# Patient Record
Sex: Female | Born: 1975 | Race: Black or African American | Hispanic: No | Marital: Single | Smoking: Never smoker
Health system: Southern US, Community
[De-identification: ages and names within clinical notes are randomized; demographics above are authoritative.]

## PROBLEM LIST (undated history)

## (undated) DIAGNOSIS — E0500 Thyrotoxicosis with diffuse goiter without thyrotoxic crisis or storm: Secondary | ICD-10-CM

## (undated) DIAGNOSIS — Z9889 Other specified postprocedural states: Secondary | ICD-10-CM

## (undated) HISTORY — PX: TUBAL LIGATION: SHX77

---

## 1998-08-07 ENCOUNTER — Emergency Department (HOSPITAL_COMMUNITY): Admission: EM | Admit: 1998-08-07 | Discharge: 1998-08-07 | Payer: Self-pay | Admitting: Emergency Medicine

## 1999-08-20 ENCOUNTER — Emergency Department (HOSPITAL_COMMUNITY): Admission: EM | Admit: 1999-08-20 | Discharge: 1999-08-20 | Payer: Self-pay

## 2000-02-26 ENCOUNTER — Emergency Department (HOSPITAL_COMMUNITY): Admission: EM | Admit: 2000-02-26 | Discharge: 2000-02-26 | Payer: Self-pay | Admitting: Emergency Medicine

## 2000-08-26 ENCOUNTER — Inpatient Hospital Stay (HOSPITAL_COMMUNITY): Admission: AD | Admit: 2000-08-26 | Discharge: 2000-08-26 | Payer: Self-pay | Admitting: *Deleted

## 2000-08-30 ENCOUNTER — Encounter: Payer: Self-pay | Admitting: *Deleted

## 2000-08-30 ENCOUNTER — Inpatient Hospital Stay (HOSPITAL_COMMUNITY): Admission: AD | Admit: 2000-08-30 | Discharge: 2000-08-30 | Payer: Self-pay | Admitting: *Deleted

## 2000-09-13 ENCOUNTER — Other Ambulatory Visit: Admission: RE | Admit: 2000-09-13 | Discharge: 2000-09-13 | Payer: Self-pay | Admitting: Obstetrics & Gynecology

## 2001-04-14 ENCOUNTER — Inpatient Hospital Stay (HOSPITAL_COMMUNITY): Admission: AD | Admit: 2001-04-14 | Discharge: 2001-04-17 | Payer: Self-pay | Admitting: Obstetrics and Gynecology

## 2002-06-09 ENCOUNTER — Emergency Department (HOSPITAL_COMMUNITY): Admission: EM | Admit: 2002-06-09 | Discharge: 2002-06-09 | Payer: Self-pay | Admitting: Emergency Medicine

## 2002-10-10 ENCOUNTER — Emergency Department (HOSPITAL_COMMUNITY): Admission: EM | Admit: 2002-10-10 | Discharge: 2002-10-10 | Payer: Self-pay | Admitting: Emergency Medicine

## 2003-09-26 ENCOUNTER — Ambulatory Visit (HOSPITAL_COMMUNITY): Admission: RE | Admit: 2003-09-26 | Discharge: 2003-09-26 | Payer: Self-pay | Admitting: Family Medicine

## 2003-10-01 ENCOUNTER — Other Ambulatory Visit: Admission: RE | Admit: 2003-10-01 | Discharge: 2003-10-01 | Payer: Self-pay | Admitting: Obstetrics and Gynecology

## 2003-11-20 ENCOUNTER — Other Ambulatory Visit: Admission: RE | Admit: 2003-11-20 | Discharge: 2003-11-20 | Payer: Self-pay | Admitting: Obstetrics and Gynecology

## 2004-01-13 ENCOUNTER — Ambulatory Visit (HOSPITAL_COMMUNITY): Admission: RE | Admit: 2004-01-13 | Discharge: 2004-01-13 | Payer: Self-pay | Admitting: Obstetrics and Gynecology

## 2004-06-03 ENCOUNTER — Inpatient Hospital Stay (HOSPITAL_COMMUNITY): Admission: RE | Admit: 2004-06-03 | Discharge: 2004-06-06 | Payer: Self-pay | Admitting: Obstetrics and Gynecology

## 2004-06-03 ENCOUNTER — Encounter (INDEPENDENT_AMBULATORY_CARE_PROVIDER_SITE_OTHER): Payer: Self-pay | Admitting: Specialist

## 2004-06-29 ENCOUNTER — Other Ambulatory Visit: Admission: RE | Admit: 2004-06-29 | Discharge: 2004-06-29 | Payer: Self-pay | Admitting: Obstetrics and Gynecology

## 2005-07-12 ENCOUNTER — Other Ambulatory Visit: Admission: RE | Admit: 2005-07-12 | Discharge: 2005-07-12 | Payer: Self-pay | Admitting: Obstetrics and Gynecology

## 2005-11-18 ENCOUNTER — Emergency Department (HOSPITAL_COMMUNITY): Admission: EM | Admit: 2005-11-18 | Discharge: 2005-11-19 | Payer: Self-pay | Admitting: Emergency Medicine

## 2006-06-01 ENCOUNTER — Emergency Department (HOSPITAL_COMMUNITY): Admission: EM | Admit: 2006-06-01 | Discharge: 2006-06-01 | Payer: Self-pay | Admitting: Emergency Medicine

## 2006-08-01 ENCOUNTER — Ambulatory Visit: Payer: Self-pay | Admitting: Family Medicine

## 2006-08-02 ENCOUNTER — Ambulatory Visit: Payer: Self-pay | Admitting: *Deleted

## 2006-10-04 ENCOUNTER — Ambulatory Visit: Payer: Self-pay | Admitting: Family Medicine

## 2006-10-04 ENCOUNTER — Encounter (INDEPENDENT_AMBULATORY_CARE_PROVIDER_SITE_OTHER): Payer: Self-pay | Admitting: Family Medicine

## 2007-01-31 ENCOUNTER — Ambulatory Visit: Payer: Self-pay | Admitting: Internal Medicine

## 2007-03-01 ENCOUNTER — Encounter (INDEPENDENT_AMBULATORY_CARE_PROVIDER_SITE_OTHER): Payer: Self-pay | Admitting: *Deleted

## 2007-09-05 ENCOUNTER — Ambulatory Visit: Payer: Self-pay | Admitting: Family Medicine

## 2007-10-13 ENCOUNTER — Ambulatory Visit: Payer: Self-pay | Admitting: Family Medicine

## 2007-10-13 ENCOUNTER — Encounter (INDEPENDENT_AMBULATORY_CARE_PROVIDER_SITE_OTHER): Payer: Self-pay | Admitting: Family Medicine

## 2007-10-13 LAB — CONVERTED CEMR LAB
Chlamydia, DNA Probe: NEGATIVE
GC Probe Amp, Genital: NEGATIVE

## 2007-11-09 ENCOUNTER — Emergency Department (HOSPITAL_COMMUNITY): Admission: EM | Admit: 2007-11-09 | Discharge: 2007-11-10 | Payer: Self-pay | Admitting: Emergency Medicine

## 2007-11-28 ENCOUNTER — Ambulatory Visit: Payer: Self-pay | Admitting: Internal Medicine

## 2008-03-15 ENCOUNTER — Ambulatory Visit: Payer: Self-pay | Admitting: Internal Medicine

## 2008-06-12 ENCOUNTER — Ambulatory Visit: Payer: Self-pay | Admitting: Internal Medicine

## 2008-06-12 ENCOUNTER — Encounter (INDEPENDENT_AMBULATORY_CARE_PROVIDER_SITE_OTHER): Payer: Self-pay | Admitting: Family Medicine

## 2008-06-12 LAB — CONVERTED CEMR LAB
Bilirubin Urine: NEGATIVE
Hemoglobin, Urine: NEGATIVE
Ketones, ur: NEGATIVE mg/dL
Leukocytes, UA: NEGATIVE
Nitrite: NEGATIVE
Protein, ur: NEGATIVE mg/dL
RBC / HPF: NONE SEEN (ref <3)
Specific Gravity, Urine: 1.022 (ref 1.005–1.03)
Urine Glucose: NEGATIVE mg/dL
Urobilinogen, UA: 1 (ref 0.0–1.0)
pH: 6 (ref 5.0–8.0)

## 2008-06-13 ENCOUNTER — Encounter (INDEPENDENT_AMBULATORY_CARE_PROVIDER_SITE_OTHER): Payer: Self-pay | Admitting: Family Medicine

## 2008-07-19 ENCOUNTER — Emergency Department (HOSPITAL_COMMUNITY): Admission: EM | Admit: 2008-07-19 | Discharge: 2008-07-19 | Payer: Self-pay | Admitting: Emergency Medicine

## 2010-09-29 LAB — POCT RAPID STREP A (OFFICE): Streptococcus, Group A Screen (Direct): NEGATIVE

## 2010-10-30 NOTE — Op Note (Signed)
NAMEJERRELL, Deborah Murphy              ACCOUNT NO.:  [id number]   MEDICAL RECORD NO.:  [id number]          PATIENT TYPE:  INP   LOCATION:  9104                          FACILITY:  [facility]   PHYSICIAN:  Randye Lobo, M.D.   DATE OF BIRTH:  [birth year]   DATE OF PROCEDURE:  DATE OF DISCHARGE:                                 OPERATIVE REPORT   PREOPERATIVE DIAGNOSES:  1.  History of prior cesarean section, desire for repeat.  2.  Desire for permanent sterilization.   POSTOPERATIVE DIAGNOSES:  1.  History of prior cesarean section, desire for repeat cesarean section.  2.  Desire for permanent sterilization.   PROCEDURES:  1.  Repeat low segment transverse cesarean section.  2.  Bilateral tubal ligation by the modified Pomeroy technique.   ANESTHESIA:  Spinal.   FLUIDS REPLACED:  2400 mL Ringer's lactate.   ESTIMATED BLOOD LOSS:  600 mL.   URINE OUTPUT:  350 mL.   COMPLICATIONS:  None.   SPECIMENS:  A portion of the right and left fallopian tubes were sent to  pathology.   INDICATION FOR PROCEDURE:  The patient is a 35 year old gravida 3, para 2-0-  0-2, African-American female at 68 +38 weeks' gestation ([facility] June 14, 2004), who has a history of two prior cesarean sections and a desire for a  repeat cesarean section and the avoidance of labor.  The patient also  desires permanent sterilization, and she declines reversible contraception.  A plan is made to proceed with a repeat cesarean section and bilateral tubal  ligation after risks, benefits, and alternatives are discussed with her.  The patient understands there is a failure rate of the tubal ligation of  approximately one in 250 to one in 300, which may result in either an  intrauterine or ectopic pregnancy.   FINDINGS:  A viable female was delivered at 12:37 p.m.  The Apgars were 9 at  one minute and 9 at five minutes.  The weight was 7 pounds 4 ounces.  The  newborn was noted to be vigorous at birth.   The amniotic  fluid was clear.  The placenta had a normal insertion of a  three-vessel cord.  The uterus, tubes, and ovaries were normal.   PROCEDURE:  The patient was  re-identified.  She was taken to the operating  room, where a spinal anesthetic was administered.  She was placed in the  supine position with a left lateral tilt and the abdomen was then sterilely  prepped and draped and a Foley catheter was sterilely placed inside the  bladder.  The procedure began with a Pfannenstiel incision created sharply  with a scalpel.  This was carried down to the fascia using the scalpel.  The  scalpel was then used to incise the fascia in the midline and the incision  was carried out bilaterally with a Mayo.  The muscles were sharply dissected  from the overlying fascia superiorly and inferiorly.  The rectus muscles  were then sharply divided in the midline.  The parietal peritoneum was  entered sharply after it was elevated with two  hemostat clamps.  The  peritoneal incision was extended cranially and caudally, and the bladder  flap was created.  A transverse lower uterine segment incision was then  created sharply with a scalpel and the incision was extended bilaterally in  an upward fashion with the bandage scissors.  The vertex was elevated  through the uterine incision without difficulty.  The remainder of the  newborn was delivered after the nares and mouth were suctioned.  The cord  was doubly clamped and cut, and the newborn was carried over to the awaiting  pediatricians.  The placenta was manually extracted after cord blood was  obtained.  The uterus was wiped clean with a moistened lap pad.  The uterus  was exteriorized for its closure.  The uterine incision was then closed in a  single-layer closure of #1 chromic, which was performed in a running locked  fashion.   The bilateral tubal ligation was performed next.  The left fallopian tube  was grasped with a Babcock clamp and followed to its  fimbriated end.  A free  tie of 0 plain gut suture was then tied in the isthmic portion of the  fallopian tube such that a knuckle of tissue was created.  A second free tie  of 0 plain gut was placed as wel.  The intervening portion of fallopian tube  was excised after a hemostat clamp was brought through the mesosalpinx.  The  intervening segment of fallopian tube was sent to pathology.  Hemostasis was  excellent.  The same procedure that was performed on the left fallopian tube  was then repeated on the right fallopian tube after it was grasped and  followed to its fimbriated end.   The uterus was returned to the peritoneal cavity, which was irrigated and  suctioned.  The uterine incision was hemostatic, as were the tubal ligation  sites.  The abdomen was next closed with a running suture of 3-0 Vicryl on  the parietal peritoneum.  The recuts muscles were reapproximated with  interrupted sutures of #1 chromic.  The fascia was closed with a running  suture of 0 Vicryl.  The subcutaneous tissue was irrigated and suctioned and  made hemostatic with monopolar cautery.  The skin was closed with staples  and a sterile pressure bandage was placed over this.   There were no complications of the procedure.  All needle, instrument, and  lap counts were correct. The patient is escorted to the recovery room in  stable and awake condition.      BES/MEDQ  D:  06/03/2004  T:  06/03/2004  Job:  [id number]

## 2010-10-30 NOTE — Op Note (Signed)
[facility]  Patient:    Deborah Murphy, Deborah Murphy Visit Number: [id number] MRN: [id number]          Service Type: OBS Location: 910A 9138 01 Attending Physician:  Miguel Aschoff Proc. Date: 04/14/01 Admit Date:  04/14/2001                             Operative Report  PREOPERATIVE DIAGNOSIS:       Intrauterine pregnancy at term for elective repeat cesarean section.  POSTOPERATIVE DIAGNOSIS:      Intrauterine pregnancy at term for elective repeat cesarean section.  OPERATION:                    Repeat low transverse cesarean section.  SURGEON:                      Miguel Aschoff, M.D.  ASSISTANT:  ANESTHESIA:                   Spinal.  COMPLICATIONS:                None.  ESTIMATED BLOOD LOSS:  INDICATIONS:                  The patient is a 35 year old black female, gravida 2, para 1-0-0-1, status post prior cesarean section in 1998 for fetal distress.  The patient requests a repeat cesarean section and declines an attempt at [facility].  The risks and benefits of the procedure have been discussed with the patient and informed consent has been obtained.  DESCRIPTION OF PROCEDURE:     The patient was taken to the operating room and placed in the sitting position.  Spinal anesthesia was administered without difficulty.  After this was done, she was prepped and draped in the usual sterile fashion.  A Foley catheter was inserted.  A Pfannenstiel incision was then made and extended down through the subcutaneous tissue with bleeding points being clamped and coagulated as they were encountered.  The fascia was then identified and incised transversely and separated from the underlying rectus muscles.  The rectus muscles were divided in the midline.  The peritoneum was then identified and entered carefully avoiding underlying structures.  The bladder flap was then created and protected with the bladder blade.  An elliptical transverse incision was made into the lower  uterine segment.  The amniotic cavity was entered.  Clear fluid was obtained.  At this point, the patient was delivered of a viable female infant, Apgars 9 at one minute and 9 at five minutes from a vertex LOP position.  On delivery, the nose and mouth were suctioned and the baby was handed to the pediatric team in attendance.  Cord bloods were then obtained for appropriate studies.  The placenta was then removed and the uterus was evacuated of any remaining products of conception.  The angles of the uterine incision were then ligated using figure-of-eight sutures of #1 Vicryl and then the uterus was closed in layers.  The first layer was a running interlocking suture of #1 Vicryl followed by an imbricating suture of #1 Vicryl.  The bladder flap was reapproximated using running continuous 2-0 Vicryl suture. At this point, the tubes and ovaries were inspected and noted to be within normal limits.  Lap counts were taken and found to be correct.  Then the abdomen was closed.  The parietoperitoneum was then closed using running  continuous 0 Vicryl suture. The rectus muscles were reapproximated using running continuous 0 Vicryl suture. The fascia was closed using two sutures of 0 Vicryl each starting at the lateral fascial angles and meeting in the midline.  The subcutaneous tissue and skin were closed using staples.  The estimated blood loss was approximately 500 cc.  The patient was taken to the recovery room in satisfactory condition.  The baby was taken to the nursery in satisfactory condition. Attending Physician:  Miguel Aschoff DD:  04/14/01 TD:  04/15/01 Job: [id number]

## 2010-10-30 NOTE — Discharge Summary (Signed)
Deborah Murphy, Deborah Murphy              ACCOUNT NO.:  [id number]   MEDICAL RECORD NO.:  [id number]          PATIENT TYPE:  INP   LOCATION:  9104                          FACILITY:  [facility]   PHYSICIAN:  Carrington Clamp, M.D. DATE OF BIRTH:  03/09/1976   DATE OF ADMISSION:  06/03/2004  DATE OF DISCHARGE:  06/06/2004                                 DISCHARGE SUMMARY   FINAL DIAGNOSIS:  Intrauterine pregnancy at term, history of prior cesarean  section, desires repeat cesarean section, desire for permanent  sterilization.   PROCEDURE:  Repeat low-transverse cesarean section and bilateral tubal  ligation by the modified Pomeroy technique.   SURGEON:  Dr. Randye Lobo.   ASSISTANT DOCTOR:  Dr. Miguel Aschoff.   COMPLICATIONS:  None.   This 36 year old G3, P2-0-0-2 presents at 38-3/7-weeks' gestation for a  repeat cesarean section.  Patient has a history of 2 prior cesarean  sections, desires a repeat cesarean section and also desires permanent  sterilization.  She declines any type of reversible contraception.  Patient's antepartum course up to this point had been uncomplicated.  She  had a negative Group B strep and culture obtained in the office at 36 weeks.  Patient is taken to the operating room on June 03, 2004 by Dr. Randye Lobo where a repeat low-transverse cesarean section was performed with the  delivery of a 7 pound 4 ounce female infant with Apgars of 9 and 9.  Delivery went without complication.  After delivery, the pediatrician noted  that the baby may have Down's syndrome.  The baby was undergoing karyotype  study at this time.  But patient's postpartum course was uncomplicated.  She  was felt ready for discharge on postoperative day #3.   DIET:  Was sent home on a regular diet.   ACTIVITY:  Told to decrease activities.   DISCHARGE MEDICATIONS:  Told she could use Percocet 5 mg 1 q.4-6h. p.r.n.  pain.   FOLLOWUP:  Was to follow up in the office in 4 weeks.  I am  unsure of the  Down's syndrome status of the baby upon discharge.   DISCHARGE LABS:  Patient had a hemoglobin of 11.5, white blood cell count of  11.6, and platelets of 177,000.     MB/MEDQ  D:  07/02/2004  T:  07/02/2004  Job:  [id number]

## 2010-10-30 NOTE — Discharge Summary (Signed)
[facility]  Patient:    Deborah Murphy, Deborah Murphy Visit Number: [id number] MRN: [id number]          Service Type: OBS Location: 910A 9138 01 Attending Physician:  Miguel Aschoff Dictated by:   Devoria Albe Edward Jolly, M.D. Admit Date:  04/14/2001 Discharge Date: 04/17/2001                             Discharge Summary  ADMISSION DIAGNOSES: 1. Intrauterine pregnancy at term. 2. History of prior cesarean section, declines trial of vaginal delivery.  DISCHARGE DIAGNOSES: 1. Intrauterine pregnancy at term, delivered. 2. History of prior cesarean section, declines trial of vaginal delivery. 3. Status post repeat low segment transverse cesarean section.  OPERATIONS AND PROCEDURES:  A repeat low segment transverse cesarean section was performed on 04/14/01, at the [facility] under the direction of Dr. Miguel Aschoff.  HISTORY OF PRESENT ILLNESS AND PHYSICAL EXAMINATION:  The patient was a 35 year old, gravida 2, para 1-0-0-1, female who was admitted at [redacted] weeks gestation (estimated date of confinement 04/21/01) with a history of a prior cesarean section in 1998 for a non-reassuring fetal assessment.  The infant weighed 9 pounds 13 ounces.  Throughout the patients course she was counseled as for her options of delivery, including an elective repeat cesarean section versus a trial of vaginal delivery, and she chose to proceed with a repeat cesarean delivery. The patients fundal height measured appropriately throughout her prenatal course, and her cervix was not dilating nor effacing.  HOSPITAL COURSE:  The patient was admitted on 04/14/01, at which time she underwent a repeat low segment transverse cesarean section under the direction of Dr. Miguel Aschoff.  A viable female infant was delivered with Apgars of 9 at one minute and 9 at five minutes.  This surgery was noted to be uncomplicated.  The patient had an unremarkable postoperative course.  At the time of  the patients discharge she was tolerating a regular diet, had good control of her pain with Percocet and ibuprofen, and she was ambulating independently.  The incision remained clean, dry, and intact, and she demonstrated no evidence of fevers during her postoperative recovery period.  Her discharge hematocrit was noted to be 24.7%, and she was tolerating this well.  The patient was found to be in good condition and ready for discharge on postoperative day #3.  DISCHARGE MEDICATIONS: 1. Percocet one or two p.o. q.4-6h. p.r.n. 2. The patient will continue on a daily prenatal vitamin. 3. The patient was instructed to take iron sulfate 325 mg p.o. t.i.d.  ACTIVITY:  She will have decreased activity for six weeks.  FOLLOWUP:  She will come to the office in two days for staple removal.  The patient will call if she experiences any fever, increased pain, heavy vaginal bleeding, erythema or drainage from the incision, or any other concern. Dictated by:   Devoria Albe Edward Jolly, M.D. Attending Physician:  Miguel Aschoff DD:  05/04/01 TD:  05/04/01 Job: [id number]

## 2010-10-30 NOTE — Discharge Summary (Signed)
[facility]  Patient:    Deborah Murphy, Deborah Murphy Visit Number: [id number] MRN: [id number]          Service Type: OBS Location: 910A 9138 01 Attending Physician:  Miguel Aschoff Dictated by:   Devoria Albe Edward Jolly, M.D. Admit Date:  04/14/2001 Discharge Date: 04/17/2001                             Discharge Summary  ADMISSION DIAGNOSES:          1. Intrauterine pregnancy at term.                               2. History of prior cesarean section, declines                                  trial of vaginal delivery.  DISCHARGE DIAGNOSES:          1. Intrauterine pregnancy at term, delivered.                               2. History of prior cesarean section, declines                                  trial of vaginal delivery.                               3. Status post repeat low segment transverse                                  cesarean section.  SIGNIFICANT OPERATIONS        A repeat low segment transverse cesarean section AND PROCEDURES:               was performed on April 14, 2001 at                               [facility] under the direction                               of Dr. Miguel Aschoff.  ADMISSION HISTORY AND PHYSICAL EXAMINATION:         The patient was a 35 year old, gravida 2, para 1-0-0-1, female who was admitted at [redacted] weeks gestation ([facility] April 21, 2001) with a history of a prior cesarean section in 1998 for a nonreassuring fetal assessment. The infant weighed 9 pounds 13 ounces.  Throughout the patients antepartum course, she was counseled for her options for delivery including an elective repeat cesarean section versus a trial of vaginal delivery, and she chose to proceed with a repeat cesarean delivery. The patients fundal height measured appropriately throughout her prenatal course, and her cervix was not dilating nor effacing.  HOSPITAL COURSE:              The patient was admitted on April 14, 2001 at which time she  underwent a repeat low segment transverse cesarean section under the direction of  Dr. Miguel Aschoff. A viable female infant was delivered with Apgars of 9 at one minute and 9 at five minutes. The surgery was noted to be uncomplicated.  The patient had an unremarkable postoperative course. At the time of the patients discharge, she was tolerating a regular diet, had good control of her pain with Percocet and Ibuprofen, and she was ambulating independently. The incision remained clean, dry and intact, and she demonstrated no evidence of fevers during her postoperative recovery. Her discharge hematocrit was noted to be 24.7% and she was tolerating this well.  The patient was found to be in good condition and ready for discharge on postoperative day #3.  DISCHARGE INSTRUCTIONS:       Her instructions at discharge included a prescription for Percocet one to two p.o. q.4-6h. p.r.n. The patient will continue on a daily prenatal vitamin. The patient was instructed to take iron sulfate 325 mg p.o. t.i.d. She will have decreased activity for six weeks. She will come to the office in two days for staple removal. The patient will call if she experiences any fever, increased pain, heavy vaginal bleeding, erythema or drainage from the incision, or any other concerns.Dictated by:   Devoria Albe Edward Jolly, M.D. Attending Physician:  Miguel Aschoff DD:  05/04/01 TD:  05/04/01 Job: [id number]

## 2010-11-24 ENCOUNTER — Emergency Department (HOSPITAL_COMMUNITY)
Admission: EM | Admit: 2010-11-24 | Discharge: 2010-11-24 | Disposition: A | Discharge status: Home / Self Care | Payer: No Typology Code available for payment source | Attending: Emergency Medicine | Admitting: Emergency Medicine

## 2010-11-24 DIAGNOSIS — M79609 Pain in unspecified limb: Secondary | ICD-10-CM | POA: Insufficient documentation

## 2010-11-24 DIAGNOSIS — Y929 Unspecified place or not applicable: Secondary | ICD-10-CM | POA: Insufficient documentation

## 2010-11-24 DIAGNOSIS — IMO0002 Reserved for concepts with insufficient information to code with codable children: Secondary | ICD-10-CM | POA: Insufficient documentation

## 2012-02-16 ENCOUNTER — Emergency Department (HOSPITAL_COMMUNITY)
Admission: EM | Admit: 2012-02-16 | Discharge: 2012-02-16 | Disposition: A | Discharge status: Home / Self Care | Payer: 59 | Attending: Emergency Medicine | Admitting: Emergency Medicine

## 2012-02-16 ENCOUNTER — Encounter (HOSPITAL_COMMUNITY): Payer: Self-pay | Admitting: Emergency Medicine

## 2012-02-16 ENCOUNTER — Emergency Department (HOSPITAL_COMMUNITY): Payer: 59

## 2012-02-16 DIAGNOSIS — M109 Gout, unspecified: Secondary | ICD-10-CM | POA: Insufficient documentation

## 2012-02-16 MED ORDER — IBUPROFEN 800 MG PO TABS
800.0000 mg | ORAL_TABLET | Freq: Once | ORAL | Status: AC
  Administered 2012-02-16: 800 mg via ORAL
  Filled 2012-02-16: qty 1

## 2012-02-16 MED ORDER — COLCHICINE 0.6 MG PO TABS: ORAL_TABLET | ORAL | Status: DC

## 2012-02-16 MED ORDER — COLCHICINE 0.6 MG PO TABS
1.2000 mg | ORAL_TABLET | ORAL | Status: AC
  Administered 2012-02-16: 1.2 mg via ORAL
  Filled 2012-02-16: qty 2

## 2012-02-16 MED ORDER — IBUPROFEN 800 MG PO TABS: 800.0000 mg | ORAL_TABLET | Freq: Three times a day (TID) | ORAL | Status: DC

## 2012-02-16 NOTE — ED Provider Notes (Signed)
History     CSN: [id number]  Arrival date & time 02/16/12  0910   First MD Initiated Contact with Patient 02/16/12 1011      Chief Complaint  Patient presents with  . Toe Pain    pain in l/great toe x 24 hrs. denies injury    (Consider location/radiation/quality/duration/timing/severity/associated sxs/prior treatment) HPI Comments: Patient present with left great toe joint pain. The pain started gradually yesterday and has become progressively severe. Patient denies any known trigger. The pain is dull and achy, does not radiate, and is severe. Patient denies any alleviating factors. Pain is made worse with pressure or palpation of the joint. Patient reports associated limited ROM of left great toe due to pain. Patient denies injury.    History reviewed. No pertinent past medical history.  Past Surgical History  Procedure Date  . Cesarean section     Family History  Problem Relation Age of Onset  . Hypertension Mother   . Diabetes Mother     History  Substance Use Topics  . Smoking status: Never Smoker   . Smokeless tobacco: Not on file  . Alcohol Use: No    OB History    Grav Para Term Preterm Abortions TAB SAB Ect Mult Living                  Review of Systems  Musculoskeletal: Positive for joint swelling and arthralgias.  All other systems reviewed and are negative.    Allergies  Review of patient's allergies indicates no known allergies.  Home Medications   Current Outpatient Rx  Name Route Sig Dispense Refill  . ASPIRIN 325 MG PO TABS Oral Take 325 mg by mouth daily as needed. Pain      BP 135/77  Pulse 92  Temp 98.5 F (36.9 C) (Oral)  Resp 12  Ht 5\' 6"  (1.676 m)  Wt 125 lb (56.7 kg)  BMI 20.18 kg/m2  SpO2 100%  LMP 01/27/2012  Physical Exam  Nursing note and vitals reviewed. Constitutional: She is oriented to person, place, and time. She appears well-developed and well-nourished. No distress.  HENT:  Head: Normocephalic and atraumatic.    Eyes: Conjunctivae are normal. No scleral icterus.  Neck: Normal range of motion. Neck supple.  Cardiovascular: Normal rate, regular rhythm and intact distal pulses.  Exam reveals no gallop and no friction rub.   No murmur heard.      Sufficient capillary refill of lower extremities.   Pulmonary/Chest: Effort normal and breath sounds normal. No respiratory distress. She has no wheezes. She has no rales. She exhibits no tenderness.  Musculoskeletal:       Notable edema localized to left great toe joint. Limited ROM of left great toe joint due to pain and edema. Tenderness to palpation of left great toe joint.   Neurological: She is alert and oriented to person, place, and time.  Skin: Skin is warm and dry. She is not diaphoretic.  Psychiatric: She has a normal mood and affect. Her behavior is normal.    ED Course  Procedures (including critical care time)  Labs Reviewed - No data to display Dg Toe Great Left  02/16/2012  *RADIOLOGY REPORT*  Clinical Data: Toe pain, no known injury  LEFT GREAT TOE  Comparison: None.  Findings: Three views of the left great toe submitted.  No acute fracture or subluxation.  No radiopaque foreign body.  No periosteal reaction or bony erosion.  IMPRESSION: No acute fracture or subluxation.   Original  Report Authenticated By: Natasha Mead, M.D.      1. Gout of big toe       MDM  12:40 PM Patient's great toe shows no fracture. I will treat the patient as acute gout of left great toe. I will hive her colchicine here and discharge her with colchicine and ibuprofen. Patient should return to the ED with any worsening or concerning symptoms.         Emilia Beck, PA-C 02/18/12 [phone]

## 2012-02-16 NOTE — ED Notes (Signed)
Swelling and pain in distal joint of fifth toe ( great toe)  X 24 hrs. Denies trauma

## 2012-02-16 NOTE — ED Notes (Signed)
Patient transported to X-ray 

## 2012-02-21 NOTE — ED Provider Notes (Signed)
Medical screening examination/treatment/procedure(s) were performed by non-physician practitioner and as supervising physician I was immediately available for consultation/collaboration.   Loren Racer, MD 02/21/12 [phone]

## 2012-02-26 ENCOUNTER — Emergency Department (HOSPITAL_COMMUNITY)
Admission: EM | Admit: 2012-02-26 | Discharge: 2012-02-26 | Disposition: A | Discharge status: Home / Self Care | Payer: 59 | Attending: Emergency Medicine | Admitting: Emergency Medicine

## 2012-02-26 ENCOUNTER — Encounter (HOSPITAL_COMMUNITY): Payer: Self-pay | Admitting: Emergency Medicine

## 2012-02-26 DIAGNOSIS — R079 Chest pain, unspecified: Secondary | ICD-10-CM | POA: Insufficient documentation

## 2012-02-26 MED ORDER — NAPROXEN 375 MG PO TABS: 375.0000 mg | ORAL_TABLET | Freq: Two times a day (BID) | ORAL | Status: AC

## 2012-02-26 MED ORDER — CYCLOBENZAPRINE HCL 10 MG PO TABS: 10.0000 mg | ORAL_TABLET | Freq: Two times a day (BID) | ORAL | Status: AC | PRN

## 2012-02-26 NOTE — ED Provider Notes (Signed)
History     CSN: [id number]  Arrival date & time 02/26/12  1238   First MD Initiated Contact with Patient 02/26/12 1351      Chief Complaint  Patient presents with  . Chest Pain    (Consider location/radiation/quality/duration/timing/severity/associated sxs/prior treatment) HPI...... left chest pain for approximately one week. No radiation, no associated dyspnea, diaphoresis, nausea. Nonsmoker. No family history of cardiac disease. No chronic illnesses. Nothing makes symptoms better or worse. Severity is mild. Not associated with activity.  History reviewed. No pertinent past medical history.  Past Surgical History  Procedure Date  . Cesarean section   . Tubal ligation     Family History  Problem Relation Age of Onset  . Hypertension Mother   . Diabetes Mother     History  Substance Use Topics  . Smoking status: Never Smoker   . Smokeless tobacco: Never Used  . Alcohol Use: No    OB History    Grav Para Term Preterm Abortions TAB SAB Ect Mult Living                  Review of Systems  All other systems reviewed and are negative.    Allergies  Review of patient's allergies indicates no known allergies.  Home Medications   Current Outpatient Rx  Name Route Sig Dispense Refill  . IBUPROFEN 800 MG PO TABS Oral Take 800 mg by mouth 3 (three) times daily.    Marland Kitchen PSEUDOEPHEDRINE-ACETAMINOPHEN 30-500 MG PO TABS Oral Take 1 tablet by mouth every 4 (four) hours as needed. Congestion    . COLCHICINE 0.6 MG PO TABS Oral Take 0.6 mg by mouth daily. Take 0.6mg  (one tablet) by mouth every 1-2 hours until one of the following occurs: 1.  The pain is gone 2.  The maximum dose has been given ( no more than 3 tabs in 3 hours or 10 tabs in 24 hours) 3.  The side effects outweight the benefits    . CYCLOBENZAPRINE HCL 10 MG PO TABS Oral Take 1 tablet (10 mg total) by mouth 2 (two) times daily as needed for muscle spasms. 20 tablet 0  . NAPROXEN 375 MG PO TABS Oral Take 1  tablet (375 mg total) by mouth 2 (two) times daily. 20 tablet 0    BP 154/99  Pulse 91  Temp 99 F (37.2 C) (Oral)  Resp 16  Ht 5\' 6"  (1.676 m)  Wt 125 lb (56.7 kg)  BMI 20.18 kg/m2  SpO2 100%  LMP 01/23/2012  Physical Exam  Nursing note and vitals reviewed. Constitutional: She is oriented to person, place, and time. She appears well-developed and well-nourished.  HENT:  Head: Normocephalic and atraumatic.  Eyes: Conjunctivae normal and EOM are normal. Pupils are equal, round, and reactive to light.  Neck: Normal range of motion. Neck supple.  Cardiovascular: Normal rate, regular rhythm and normal heart sounds.   Pulmonary/Chest: Effort normal and breath sounds normal.  Abdominal: Soft. Bowel sounds are normal.  Musculoskeletal: Normal range of motion.  Neurological: She is alert and oriented to person, place, and time.  Skin: Skin is warm and dry.  Psychiatric: She has a normal mood and affect.    ED Course  Procedures (including critical care time)  Labs Reviewed - No data to display No results found.  Date: 02/26/2012  Rate: 88  Rhythm: normal sinus rhythm  QRS Axis: normal  Intervals: normal  ST/T Wave abnormalities: normal  Conduction Disutrbances: none  Narrative Interpretation: unremarkable  1. Chest pain       MDM  EKG is normal. Patient is extremely low risk for acute coronary syndrome pulmonary embolus. Pulse ox 100%. No further testing necessary. Discharge home with Naprosyn and Flexeril.        Donnetta Hutching, MD 02/26/12 1421

## 2012-02-26 NOTE — ED Notes (Signed)
Pt reports chest pressure for over a week, intermittent initially but now hurts all the time. Pt has gotten a cold during this time and chest pain has gotten worse, also accompanied with headache.

## 2012-11-30 ENCOUNTER — Other Ambulatory Visit: Payer: Self-pay | Admitting: Obstetrics and Gynecology

## 2013-12-05 ENCOUNTER — Other Ambulatory Visit: Payer: Self-pay | Admitting: Obstetrics and Gynecology

## 2013-12-07 LAB — CYTOLOGY - PAP

## 2014-12-11 ENCOUNTER — Other Ambulatory Visit: Payer: Self-pay | Admitting: Obstetrics and Gynecology

## 2014-12-12 LAB — CYTOLOGY - PAP

## 2015-06-24 ENCOUNTER — Other Ambulatory Visit: Payer: Self-pay | Admitting: Obstetrics and Gynecology

## 2015-09-30 DIAGNOSIS — H5213 Myopia, bilateral: Secondary | ICD-10-CM | POA: Diagnosis not present

## 2015-10-20 DIAGNOSIS — J019 Acute sinusitis, unspecified: Secondary | ICD-10-CM | POA: Diagnosis not present

## 2015-12-05 DIAGNOSIS — F4323 Adjustment disorder with mixed anxiety and depressed mood: Secondary | ICD-10-CM | POA: Diagnosis not present

## 2015-12-14 DIAGNOSIS — S61212A Laceration without foreign body of right middle finger without damage to nail, initial encounter: Secondary | ICD-10-CM | POA: Diagnosis not present

## 2015-12-18 DIAGNOSIS — F4323 Adjustment disorder with mixed anxiety and depressed mood: Secondary | ICD-10-CM | POA: Diagnosis not present

## 2016-01-01 ENCOUNTER — Other Ambulatory Visit: Payer: Self-pay | Admitting: Obstetrics and Gynecology

## 2016-01-01 DIAGNOSIS — Z01419 Encounter for gynecological examination (general) (routine) without abnormal findings: Secondary | ICD-10-CM | POA: Diagnosis not present

## 2016-01-01 DIAGNOSIS — Z124 Encounter for screening for malignant neoplasm of cervix: Secondary | ICD-10-CM | POA: Diagnosis not present

## 2016-01-01 DIAGNOSIS — Z6820 Body mass index (BMI) 20.0-20.9, adult: Secondary | ICD-10-CM | POA: Diagnosis not present

## 2016-01-01 DIAGNOSIS — Z1231 Encounter for screening mammogram for malignant neoplasm of breast: Secondary | ICD-10-CM | POA: Diagnosis not present

## 2016-01-02 LAB — CYTOLOGY - PAP

## 2016-03-16 DIAGNOSIS — Z23 Encounter for immunization: Secondary | ICD-10-CM | POA: Diagnosis not present

## 2016-08-26 DIAGNOSIS — Z0000 Encounter for general adult medical examination without abnormal findings: Secondary | ICD-10-CM | POA: Diagnosis not present

## 2016-09-29 DIAGNOSIS — H52223 Regular astigmatism, bilateral: Secondary | ICD-10-CM | POA: Diagnosis not present

## 2016-09-29 DIAGNOSIS — H5213 Myopia, bilateral: Secondary | ICD-10-CM | POA: Diagnosis not present

## 2017-01-03 DIAGNOSIS — Z113 Encounter for screening for infections with a predominantly sexual mode of transmission: Secondary | ICD-10-CM | POA: Diagnosis not present

## 2017-01-03 DIAGNOSIS — Z6820 Body mass index (BMI) 20.0-20.9, adult: Secondary | ICD-10-CM | POA: Diagnosis not present

## 2017-01-03 DIAGNOSIS — Z1231 Encounter for screening mammogram for malignant neoplasm of breast: Secondary | ICD-10-CM | POA: Diagnosis not present

## 2017-01-03 DIAGNOSIS — Z01419 Encounter for gynecological examination (general) (routine) without abnormal findings: Secondary | ICD-10-CM | POA: Diagnosis not present

## 2017-01-03 DIAGNOSIS — Z124 Encounter for screening for malignant neoplasm of cervix: Secondary | ICD-10-CM | POA: Diagnosis not present

## 2017-03-15 DIAGNOSIS — Z23 Encounter for immunization: Secondary | ICD-10-CM | POA: Diagnosis not present

## 2017-04-07 ENCOUNTER — Emergency Department (HOSPITAL_COMMUNITY)
Admission: EM | Admit: 2017-04-07 | Discharge: 2017-04-07 | Disposition: A | Discharge status: Home / Self Care | Payer: BLUE CROSS/BLUE SHIELD | Attending: Emergency Medicine | Admitting: Emergency Medicine

## 2017-04-07 ENCOUNTER — Encounter (HOSPITAL_COMMUNITY): Payer: Self-pay | Admitting: Emergency Medicine

## 2017-04-07 DIAGNOSIS — N3090 Cystitis, unspecified without hematuria: Secondary | ICD-10-CM | POA: Diagnosis not present

## 2017-04-07 DIAGNOSIS — R350 Frequency of micturition: Secondary | ICD-10-CM | POA: Diagnosis present

## 2017-04-07 LAB — URINALYSIS, ROUTINE W REFLEX MICROSCOPIC
BILIRUBIN URINE: NEGATIVE
GLUCOSE, UA: 150 mg/dL — AB
KETONES UR: NEGATIVE mg/dL
NITRITE: NEGATIVE
Protein, ur: 30 mg/dL — AB
SPECIFIC GRAVITY, URINE: 1.008 (ref 1.005–1.030)
pH: 6 (ref 5.0–8.0)

## 2017-04-07 LAB — POC URINE PREG, ED: Preg Test, Ur: NEGATIVE

## 2017-04-07 MED ORDER — CEPHALEXIN 500 MG PO CAPS: 500.0000 mg | ORAL_CAPSULE | Freq: Two times a day (BID) | ORAL | 0 refills | Status: AC

## 2017-04-07 MED ORDER — FLUCONAZOLE 150 MG PO TABS: 150.0000 mg | ORAL_TABLET | Freq: Once | ORAL | 0 refills | Status: AC

## 2017-04-07 NOTE — ED Notes (Signed)
Pt ambulatory and independent at discharge.  Verbalized understanding of discharge instructions 

## 2017-04-07 NOTE — Discharge Instructions (Signed)
Please take full course of antibiotics, if symptoms are not improving please follow-up with your primary care provider, if you develop fevers or chills, flank pain or other concerning symptoms please return to the emergency department for sooner evaluation. If you develop a yeast infection you may use fluconazole.

## 2017-04-07 NOTE — ED Triage Notes (Signed)
Pt reports being treated for bacterial infection and is currently taking Flagyl then began having burning with urination along with frequency.

## 2017-04-07 NOTE — ED Provider Notes (Signed)
[place] DEPT Provider Note   CSN: [id number] Arrival date & time: 04/07/17  1900     History   Chief Complaint Chief Complaint  Patient presents with  . Urinary Tract Infection    HPI Deborah Murphy is a 41 y.o. female.  Deborah Murphy is a 41 y.o. Female presents with 2 days of burning with urination, urinary frequency and a sensation of bladder fullness. Patient reports she often feels like she needs to go the bathroom, but then produces very little urine. She reports a small amount of blood in the urine this morning. No fevers or chills, no abdominal pain, no flank pain. No history of urinary tract infections. Patient does report she was recently placed on Flagyl for BV and has been taking this for 2 days.      History reviewed. No pertinent past medical history.  There are no active problems to display for this patient.   Past Surgical History:  Procedure Laterality Date  . CESAREAN SECTION    . TUBAL LIGATION      OB History    No data available       Home Medications    Prior to Admission medications   Medication Sig Start Date End Date Taking? Authorizing Provider  cephALEXin (KEFLEX) 500 MG capsule Take 1 capsule (500 mg total) by mouth 2 (two) times daily. 04/07/17 04/14/17  Jacqlyn Larsen, PA-C  colchicine 0.6 MG tablet Take 0.6 mg by mouth daily. Take 0.6mg  (one tablet) by mouth every 1-2 hours until one of the following occurs: 1.  The pain is gone 2.  The maximum dose has been given ( no more than 3 tabs in 3 hours or 10 tabs in 24 hours) 3.  The side effects outweight the benefits 02/16/12 02/15/13  Szekalski, Verline Lema, PA-C  pseudoephedrine-acetaminophen (TYLENOL SINUS) 30-500 MG TABS Take 1 tablet by mouth every 4 (four) hours as needed. Congestion    [provider]    Family History Family History  Problem Relation Age of Onset  . Hypertension Mother   . Diabetes Mother     Social History Social  History  Substance Use Topics  . Smoking status: Never Smoker  . Smokeless tobacco: Never Used  . Alcohol use No     Allergies   Patient has no known allergies.   Review of Systems Review of Systems  Constitutional: Negative for chills and fever.  Gastrointestinal: Negative for abdominal pain, nausea and vomiting.  Genitourinary: Positive for dysuria, frequency, hematuria and urgency. Negative for difficulty urinating, flank pain, pelvic pain and vaginal discharge.     Physical Exam Updated Vital Signs BP 138/84 (BP Location: Left Arm)   Pulse 99   Temp 98.2 F (36.8 C) (Oral)   Resp 20   Ht 5\' 6"  (1.676 m)   Wt 56.7 kg (125 lb)   LMP 03/19/2017   SpO2 100%   BMI 20.18 kg/m   Physical Exam  Constitutional: She appears well-developed and well-nourished. No distress.  HENT:  Head: Normocephalic and atraumatic.  Eyes: Right eye exhibits no discharge. Left eye exhibits no discharge.  Pulmonary/Chest: Effort normal. No respiratory distress.  Abdominal: Soft. Bowel sounds are normal. She exhibits no distension and no mass. There is no tenderness. There is no guarding.  Abdomen nontender in all quadrants, no CVA tenderness  Neurological: She is alert. Coordination normal.  Skin: Skin is warm and dry. She is not diaphoretic.  Psychiatric: She has a normal mood and  affect. Her behavior is normal.  Nursing note and vitals reviewed.    ED Treatments / Results  Labs (all labs ordered are listed, but only abnormal results are displayed) Labs Reviewed  URINALYSIS, ROUTINE W REFLEX MICROSCOPIC - Abnormal; Notable for the following:       Result Value   APPearance HAZY (*)    Glucose, UA 150 (*)    Hgb urine dipstick LARGE (*)    Protein, ur 30 (*)    Leukocytes, UA LARGE (*)    Bacteria, UA MANY (*)    Squamous Epithelial / LPF 0-5 (*)    All other components within normal limits  POC URINE PREG, ED    EKG  EKG Interpretation None       Radiology No results  found.  Procedures Procedures (including critical care time)  Medications Ordered in ED Medications - No data to display   Initial Impression / Assessment and Plan / ED Course  I have reviewed the triage vital signs and the nursing notes.  Pertinent labs & imaging results that were available during my care of the patient were reviewed by me and considered in my medical decision making (see chart for details).  Pt has been diagnosed with a UTI. Pt is afebrile, no CVA tenderness, normotensive, and denies N/V. Pt to be dc home with antibiotics and instructions to follow up with PCP if symptoms persist. Patient reports history of vaginal yeast infections with antibiotic use, provided fluconazole prescription.   Final Clinical Impressions(s) / ED Diagnoses   Final diagnoses:  Cystitis    New Prescriptions Discharge Medication List as of 04/07/2017  8:59 PM    START taking these medications   Details  cephALEXin (KEFLEX) 500 MG capsule Take 1 capsule (500 mg total) by mouth 2 (two) times daily., Starting Thu 04/07/2017, Until Thu 04/14/2017, Print    fluconazole (DIFLUCAN) 150 MG tablet Take 1 tablet (150 mg total) by mouth once., Starting Thu 04/07/2017, Print         Jacqlyn Larsen, PA-C 04/07/17 2251    Forde Dandy, MD 04/07/17 [phone]

## 2017-08-30 DIAGNOSIS — Z113 Encounter for screening for infections with a predominantly sexual mode of transmission: Secondary | ICD-10-CM | POA: Diagnosis not present

## 2017-08-30 DIAGNOSIS — Z0000 Encounter for general adult medical examination without abnormal findings: Secondary | ICD-10-CM | POA: Diagnosis not present

## 2017-08-30 DIAGNOSIS — Z13220 Encounter for screening for lipoid disorders: Secondary | ICD-10-CM | POA: Diagnosis not present

## 2017-11-15 DIAGNOSIS — Z6821 Body mass index (BMI) 21.0-21.9, adult: Secondary | ICD-10-CM | POA: Diagnosis not present

## 2017-11-15 DIAGNOSIS — N760 Acute vaginitis: Secondary | ICD-10-CM | POA: Diagnosis not present

## 2018-01-06 DIAGNOSIS — Z113 Encounter for screening for infections with a predominantly sexual mode of transmission: Secondary | ICD-10-CM | POA: Diagnosis not present

## 2018-01-06 DIAGNOSIS — Z1231 Encounter for screening mammogram for malignant neoplasm of breast: Secondary | ICD-10-CM | POA: Diagnosis not present

## 2018-01-06 DIAGNOSIS — Z6821 Body mass index (BMI) 21.0-21.9, adult: Secondary | ICD-10-CM | POA: Diagnosis not present

## 2018-01-06 DIAGNOSIS — Z01419 Encounter for gynecological examination (general) (routine) without abnormal findings: Secondary | ICD-10-CM | POA: Diagnosis not present

## 2018-01-06 DIAGNOSIS — N899 Noninflammatory disorder of vagina, unspecified: Secondary | ICD-10-CM | POA: Diagnosis not present

## 2018-01-06 DIAGNOSIS — Z124 Encounter for screening for malignant neoplasm of cervix: Secondary | ICD-10-CM | POA: Diagnosis not present

## 2018-02-16 DIAGNOSIS — R600 Localized edema: Secondary | ICD-10-CM | POA: Diagnosis not present

## 2018-02-23 DIAGNOSIS — H1013 Acute atopic conjunctivitis, bilateral: Secondary | ICD-10-CM | POA: Diagnosis not present

## 2018-03-14 DIAGNOSIS — H1013 Acute atopic conjunctivitis, bilateral: Secondary | ICD-10-CM | POA: Diagnosis not present

## 2018-03-20 DIAGNOSIS — Z23 Encounter for immunization: Secondary | ICD-10-CM | POA: Diagnosis not present

## 2018-03-20 DIAGNOSIS — E039 Hypothyroidism, unspecified: Secondary | ICD-10-CM | POA: Diagnosis not present

## 2018-03-20 DIAGNOSIS — H1089 Other conjunctivitis: Secondary | ICD-10-CM | POA: Diagnosis not present

## 2018-03-20 DIAGNOSIS — H02845 Edema of left lower eyelid: Secondary | ICD-10-CM | POA: Diagnosis not present

## 2018-03-20 DIAGNOSIS — H02846 Edema of left eye, unspecified eyelid: Secondary | ICD-10-CM | POA: Diagnosis not present

## 2018-03-21 DIAGNOSIS — E039 Hypothyroidism, unspecified: Secondary | ICD-10-CM | POA: Diagnosis not present

## 2018-04-19 DIAGNOSIS — E039 Hypothyroidism, unspecified: Secondary | ICD-10-CM | POA: Diagnosis not present

## 2018-04-26 ENCOUNTER — Other Ambulatory Visit (HOSPITAL_COMMUNITY): Payer: Self-pay | Admitting: Internal Medicine

## 2018-04-26 DIAGNOSIS — E0590 Thyrotoxicosis, unspecified without thyrotoxic crisis or storm: Secondary | ICD-10-CM

## 2018-05-10 ENCOUNTER — Encounter (HOSPITAL_COMMUNITY): Payer: Self-pay

## 2018-05-10 ENCOUNTER — Encounter (HOSPITAL_COMMUNITY): Payer: 59

## 2018-05-11 ENCOUNTER — Encounter (HOSPITAL_COMMUNITY): Payer: 59

## 2018-05-17 ENCOUNTER — Encounter (HOSPITAL_COMMUNITY)
Admission: RE | Admit: 2018-05-17 | Discharge: 2018-05-17 | Disposition: A | Discharge status: Home / Self Care | Payer: BLUE CROSS/BLUE SHIELD | Source: Ambulatory Visit | Attending: Internal Medicine | Admitting: Internal Medicine

## 2018-05-17 DIAGNOSIS — E0590 Thyrotoxicosis, unspecified without thyrotoxic crisis or storm: Secondary | ICD-10-CM

## 2018-05-17 MED ORDER — SODIUM IODIDE I-123 7.4 MBQ CAPS
444.0000 | ORAL_CAPSULE | Freq: Once | ORAL | Status: AC
  Administered 2018-05-17: 444 via ORAL

## 2018-05-18 ENCOUNTER — Encounter (HOSPITAL_COMMUNITY)
Admission: RE | Admit: 2018-05-18 | Discharge: 2018-05-18 | Disposition: A | Discharge status: Home / Self Care | Payer: BLUE CROSS/BLUE SHIELD | Source: Ambulatory Visit | Attending: Internal Medicine | Admitting: Internal Medicine

## 2018-05-18 DIAGNOSIS — F419 Anxiety disorder, unspecified: Secondary | ICD-10-CM | POA: Diagnosis not present

## 2018-05-18 DIAGNOSIS — E0590 Thyrotoxicosis, unspecified without thyrotoxic crisis or storm: Secondary | ICD-10-CM | POA: Diagnosis not present

## 2018-06-09 ENCOUNTER — Ambulatory Visit (INDEPENDENT_AMBULATORY_CARE_PROVIDER_SITE_OTHER): Payer: BLUE CROSS/BLUE SHIELD | Admitting: Internal Medicine

## 2018-06-09 ENCOUNTER — Encounter: Payer: Self-pay | Admitting: Internal Medicine

## 2018-06-09 VITALS — BP 120/70 | HR 92 | Ht 66.0 in | Wt 131.0 lb

## 2018-06-09 DIAGNOSIS — E0500 Thyrotoxicosis with diffuse goiter without thyrotoxic crisis or storm: Secondary | ICD-10-CM | POA: Diagnosis not present

## 2018-06-09 MED ORDER — METHIMAZOLE 10 MG PO TABS: 10.0000 mg | ORAL_TABLET | Freq: Every day | ORAL | 3 refills | Status: DC

## 2018-06-09 NOTE — Patient Instructions (Addendum)
We recommend that you follow these hyperthyroidism instructions at home:  1) Take Methimazole 10 mg once a day  If you develop severe sore throat with high fevers OR develop unexplained yellowing of your skin, eyes, under your tongue, severe abdominal pain with nausea or vomiting --> then please get evaluated immediately.   2) Get repeat thyroid labs in 3 weeks    It is ESSENTIAL to get follow-up labs to help avoid over or undertreatment of your hyperthyroidism - both of which can be dangerous to your health.

## 2018-06-09 NOTE — Progress Notes (Signed)
Name: Deborah Murphy  MRN/ DOB: [id number], Nov 03, 1975    Age/ Sex: 42 y.o., female    PCP: Seward Carol, MD   Reason for Endocrinology Evaluation:      Date of Initial Endocrinology Evaluation: 06/11/2018     HPI: [name] is a 42 y.o. female with unremarkable past medical history . The patient presented for initial endocrinology clinic visit on 06/11/2018 for consultative assistance with her low TSH.   Pt noted left eye changes during the summer, 219 . She saw her ophthalmologist and that's when she was thought to have Graves' disease. She presented to her PCP and TSH confirmed to be low, with normal FT4 and T3.  A thyroid uptake and Scan consistent with Graves' Disease  Pt does have weight loss, she has never been able to keep weight up. She endorses occasional palpitations, tremors and irritability. She also has noted aches and pain but denies diarrhea, or heat intolerance.  Denies local neck symptoms.   S/P tubal ligation 2005  No Fh of thyroid disease.    HISTORY:   Past Medical History:Graves' Disease  Past Surgical History:  Past Surgical History:  Procedure Laterality Date  . CESAREAN SECTION    . TUBAL LIGATION        Social History:  reports that she has never smoked. She has never used smokeless tobacco. She reports that she does not drink alcohol or use drugs.  Family History: family history includes Diabetes in her mother; Hypertension in her mother.   HOME MEDICATIONS: Current Outpatient Medications on File Prior to Visit  Medication Sig Dispense Refill  . pseudoephedrine-acetaminophen (TYLENOL SINUS) 30-500 MG TABS Take 1 tablet by mouth every 4 (four) hours as needed. Congestion    . colchicine 0.6 MG tablet Take 0.6 mg by mouth daily. Take 0.6mg  (one tablet) by mouth every 1-2 hours until one of the following occurs: 1.  The pain is gone 2.  The maximum dose has been given ( no more than 3 tabs in 3 hours or 10 tabs in 24 hours) 3.  The  side effects outweight the benefits     No current facility-administered medications on file prior to visit.       REVIEW OF SYSTEMS: A comprehensive ROS was conducted with the patient and is negative except as per HPI and below:  Review of Systems  Constitutional: Positive for weight loss.  HENT: Negative for congestion and sore throat.   Eyes: Positive for blurred vision and pain.  Gastrointestinal: Positive for abdominal pain. Negative for diarrhea.  Genitourinary: Negative for frequency.  Neurological: Positive for tremors. Negative for tingling.  Endo/Heme/Allergies: Negative for polydipsia.  Psychiatric/Behavioral: Negative for depression. The patient is not nervous/anxious.        OBJECTIVE:  VS: BP 120/70 (BP Location: Left Arm, Patient Position: Sitting, Cuff Size: Normal)   Pulse 92   Ht 5\' 6"  (1.676 m)   Wt 131 lb (59.4 kg)   LMP 05/14/2018   SpO2 99%   BMI 21.14 kg/m    Wt Readings from Last 3 Encounters:  06/09/18 131 lb (59.4 kg)  04/07/17 125 lb (56.7 kg)  02/26/12 125 lb (56.7 kg)     EXAM: General: Pt appears well and is in NAD  Hydration: Well-hydrated with moist mucous membranes and good skin turgor  Eyes: External eye exam normal on the right , pt with left eye stare and lid lag.  EOM intact.   Ears, Nose, Throat: Hearing: Grossly  intact bilaterally Dental: Good dentition  Throat: Clear without mass, erythema or exudate  Neck: General: Supple without adenopathy. Thyroid: Thyroid size normal.  No goiter or nodules appreciated. No thyroid bruit.  Lungs: Clear with good BS bilat with no rales, rhonchi, or wheezes  Heart: Auscultation: RRR.  Abdomen: Normoactive bowel sounds, soft, nontender, without masses or organomegaly palpable  Extremities: BL LE: No pretibial edema normal ROM and strength.  Skin: Hair: Texture and amount normal with gender appropriate distribution Skin Inspection: No rashes. Skin Palpation: Skin temperature, texture, and  thickness normal to palpation  Neuro: Cranial nerves: II - XII grossly intact  Motor: Normal strength throughout DTRs: 2+ and symmetric in UE without delay in relaxation phase  Mental Status: Judgment, insight: Intact Orientation: Oriented to time, place, and person Mood and affect: No depression, anxiety, or agitation     DATA REVIEWED:  04/19/2018  TSH < 0.01 uIU/mL  FT4 0.85 ng/dL  T3 181 ng/dL (71-180)   Thyroid Uptake and Scan 05/18/18  Homogeneous uptake in both thyroid lobes. No focal nodules or masses.  4 hour I-123 uptake = 19.4% (normal 5-20%)  24 hour I-123 uptake = 38.7% (normal 10-30%)  IMPRESSION: Homogeneous increased uptake in the thyroid suggesting Graves Disease.   ASSESSMENT/PLAN/RECOMMENDATIONS:   1. Hyperthyroidism Secondary to Graves' Disease:  - Pt is clinically hyperthyroid  - We discussed that hyperthyroidism is a result of an autoimmune condition involving the thyroid.   - We discussed with pt the benefits of methimazole in the Tx of hyperthyroidism, as well as the possible side effects/complications of anti-thyroid drug Tx (specifically detailing the rare, but serious side effect of agranulocytosis). She was informed of need for regular thyroid function monitoring while on methimazole to ensure appropriate dosage without over-treatment. As well, we discussed the possible side effects of methimazole including the chance of rash, the small chance of liver irritation/juandice and the <=1 in 300-400 chance of sudden onset agranulocytosis.  We discussed importance of going to ED promptly (and stopping methimazole) if shewere to develop significant fever with severe sore throat of other evidence of acute infection.     We extensively discussed the various treatment options for hyperthyroidism and Graves disease including ablation therapy with radioactive iodine versus antithyroid drug treatment versus surgical therapy.  We recommended to the patient  that we felt, at this time, that thionamide therapy would be most optimal.  We discussed the various possible benefits versus side effects of the various therapies.    I carefully explained to the patient that one of the consequences of I-131 ablation treatment would likely be permanent hypothyroidism which would require long-term replacement therapy with LT4.  Pt opted for RAI ablation but given her left eye symptoms and how bothersome this is to her, we discussed trying thionamide therapy first because RAI ablation will exacerbate graves' orbitopathy.   I have advised her, will continue Thionamide therapy for 1-1.5 yrs , in the meantime , I am hoping her graves' orbitopathy will reach stability and then we could revisit RAI ablation again.   Medications : Methimazole 10 mg daily  Repeat TFT's in 3 weeks    2. Graves' Disease : - Pt with left eye graves' Orbitopathy, she is under the care of an ophthalmologist. There are no other manifestations of graves' Disease.   F/u in 6 weeks   Signed electronically by: Mack Guise, MD  [facility]  [facility] [address], [place], [place] [address] Phone:  [phone] FAX: [phone]   CC: Seward Carol, MD [address] [place] [address] [place] [address] Phone: [phone] Fax: [phone]   Return to Endocrinology clinic as below: Future Appointments  Date Time Provider [place]  06/30/2018  8:00 AM [facility] None  07/21/2018  3:40 PM Sayge Salvato, Melanie Crazier, MD [facility] None

## 2018-06-11 DIAGNOSIS — E0500 Thyrotoxicosis with diffuse goiter without thyrotoxic crisis or storm: Secondary | ICD-10-CM | POA: Insufficient documentation

## 2018-06-30 ENCOUNTER — Other Ambulatory Visit (INDEPENDENT_AMBULATORY_CARE_PROVIDER_SITE_OTHER): Payer: BLUE CROSS/BLUE SHIELD

## 2018-06-30 ENCOUNTER — Encounter: Payer: Self-pay | Admitting: Internal Medicine

## 2018-06-30 DIAGNOSIS — E0500 Thyrotoxicosis with diffuse goiter without thyrotoxic crisis or storm: Secondary | ICD-10-CM | POA: Diagnosis not present

## 2018-06-30 LAB — T4, FREE: FREE T4: 0.92 ng/dL (ref 0.60–1.60)

## 2018-06-30 LAB — TSH: TSH: <0.01 u[IU]/mL — ABNORMAL LOW (ref 0.35–4.50)

## 2018-07-01 LAB — T3: T3, Total: 128 ng/dL (ref 76–181)

## 2018-07-21 ENCOUNTER — Ambulatory Visit: Payer: BLUE CROSS/BLUE SHIELD | Admitting: Internal Medicine

## 2018-07-21 ENCOUNTER — Encounter: Payer: Self-pay | Admitting: Internal Medicine

## 2018-07-21 VITALS — BP 124/80 | HR 74 | Ht 66.0 in | Wt 133.4 lb

## 2018-07-21 DIAGNOSIS — E0500 Thyrotoxicosis with diffuse goiter without thyrotoxic crisis or storm: Secondary | ICD-10-CM | POA: Diagnosis not present

## 2018-07-21 NOTE — Progress Notes (Signed)
Name: Deborah Deborah Murphy Deborah Murphy  MRN/ DOB: [id number], [birth year]    Age/ Sex: 43 y.o., female     PCP: Seward Carol, MD   Reason for Endocrinology Evaluation: Hyperthyroidism     Initial Endocrinology Clinic Visit: 06/09/18    PATIENT IDENTIFIER: Ms. Deborah Deborah Murphy Deborah Murphy is a 43 y.o., female with a and remarkable past medical history. She has followed with [facility] Endocrinology clinic since June 09, 2018 for consultative assistance with management of her Deborah Deborah Murphy Deborah Murphy' disease.   HISTORICAL SUMMARY: Pt noted left eye changes during the summer, 219 . She saw her ophthalmologist and that's when she was thought to have Graves' disease. She presented to her PCP and TSH confirmed to be low, with normal FT4 and T3.  A thyroid uptake and Scan consistent with Graves' Disease S/P tubal ligation 2005  No Fh of thyroid disease SUBJECTIVE:   During last visit (06/09/2018): Started Methimazole 10 mg daily   Today (07/23/2018):  Deborah Deborah Murphy Deborah Murphy is here for a 6 week follow up on her Graves' Disease.   She continues to  c/o tearing and discomfort  Denies local neck symptoms  She denies any weight loss, has cold intolerance . Denies diarrhea, but has abdominal pains especially around her menstrual .   Denies palpitations, or jittery sensation but has occasional anxiety.     ROS:  As per HPI.   HISTORY:   Past Medical History: Graves' disease Past Surgical History:  Past Surgical History:  Procedure Laterality Date  . CESAREAN SECTION    . TUBAL LIGATION      Social History:  reports that she has never smoked. She has never used smokeless tobacco. She reports that she does not drink alcohol or use drugs.  Family History: family history includes Diabetes in her mother; Hypertension in her mother.   HOME MEDICATIONS: Allergies as of 07/21/2018   No Known Allergies     Medication List       Accurate as of July 21, 2018 11:59 PM. Always use your most recent med list.        colchicine 0.6 MG  tablet Take 0.6 mg by mouth daily. Take 0.6mg  (one tablet) by mouth every 1-2 hours until one of the following occurs: 1.  The pain is gone 2.  The maximum dose has been given ( no more than 3 tabs in 3 hours or 10 tabs in 24 hours) 3.  The side effects outweight the benefits   methimazole 10 MG tablet Commonly known as:  TAPAZOLE Take 1 tablet (10 mg total) by mouth daily.   pseudoephedrine-acetaminophen 30-500 MG Tabs tablet Commonly known as:  TYLENOL SINUS Take 1 tablet by mouth every 4 (four) hours as needed. Congestion         OBJECTIVE:   PHYSICAL EXAM: VS: BP 124/80 (BP Location: Left Arm, Patient Position: Sitting, Cuff Size: Normal)   Pulse 74   Ht 5\' 6"  (1.676 m)   Wt 133 lb 6.4 oz (60.5 kg)   BMI 21.53 kg/m    EXAM: General: Pt appears well and is in NAD  Eyes, Nose, Throat: Eyes: Mild stare on the left, no lid lag or exophthalmos Dental: Good dentition  Throat: Clear without mass, erythema or exudate  Neck: General: Supple without adenopathy. Thyroid: Thyroid size normal.  No goiter or nodules appreciated. No thyroid bruit.  Lungs: Clear with good BS bilat with no rales, rhonchi, or wheezes  Heart: Auscultation: RRR.  Abdomen: Normoactive bowel sounds, soft, nontender, without masses or organomegaly palpable  Extremities:  BL LE: No pretibial edema normal ROM and strength.  Mental Status: Judgment, insight: Intact Orientation: Oriented to time, place, and person Memory: Intact for recent and remote events Mood and affect: No depression, anxiety, or agitation     DATA REVIEWED:   04/19/2018  TSH < 0.01 uIU/mL  FT4 0.85 ng/dL  T3 181 ng/dL (71-180)   Results for Deborah Deborah Murphy, Deborah Murphy (MRN [id number]) as of 07/23/2018 21:42  Ref. Range 06/30/2018 08:23  TSH Latest Ref Range: 0.35 - 4.50 uIU/mL <0.01 (L)  Triiodothyronine (T3) Latest Ref Range: 76 - 181 ng/dL 128  T4,Free(Direct) Latest Ref Range: 0.60 - 1.60 ng/dL 0.92     ASSESSMENT / PLAN /  RECOMMENDATIONS:   1.  Hyperthyroidism secondary to Graves' disease:  -She is clinically euthyroid -We will repeat TFTs today -She denies any side effects to methimazole -She denies any local neck symptoms   Medications  Continue methimazole 10 mg daily Repeat labs in 4 weeks   2.  Graves' disease: -Patient with mild left Graves' orbitopathy, patient is following up with an ophthalmologist.   Follow-up in 8 weeks     Signed electronically by: Mack Guise, MD  [facility]  [facility] [address] [place], [place] [address] Phone: [phone] FAX: [phone]      CC: Seward Carol, [address] [place] [address] [place] [address] Phone: [phone]  Fax: [phone]   Return to Endocrinology clinic as below: Future Appointments  Date Time Provider [place]  08/18/2018 11:00 AM [facility] None  09/15/2018  3:00 PM Gertrue Willette, Melanie Crazier, MD [facility] None

## 2018-07-21 NOTE — Patient Instructions (Signed)
We recommend that you follow these hyperthyroidism instructions at home:  1) Take Methimazole 10 mg once a day  If you develop severe sore throat with high fevers OR develop unexplained yellowing of your skin, eyes, under your tongue, severe abdominal pain with nausea or vomiting --> then please get evaluated immediately.  2) Get repeat thyroid labs in 4 weeks .   It is ESSENTIAL to get follow-up labs to help avoid over or undertreatment of your hyperthyroidism - both of which can be dangerous to your health.      

## 2018-08-18 ENCOUNTER — Telehealth: Payer: Self-pay | Admitting: Internal Medicine

## 2018-08-18 ENCOUNTER — Other Ambulatory Visit (INDEPENDENT_AMBULATORY_CARE_PROVIDER_SITE_OTHER): Payer: BLUE CROSS/BLUE SHIELD

## 2018-08-18 ENCOUNTER — Other Ambulatory Visit: Payer: Self-pay

## 2018-08-18 DIAGNOSIS — E0500 Thyrotoxicosis with diffuse goiter without thyrotoxic crisis or storm: Secondary | ICD-10-CM | POA: Diagnosis not present

## 2018-08-18 LAB — COMPREHENSIVE METABOLIC PANEL
ALK PHOS: 49 U/L (ref 39–117)
ALT: 24 U/L (ref 0–35)
AST: 20 U/L (ref 0–37)
Albumin: 4.5 g/dL (ref 3.5–5.2)
BUN: 13 mg/dL (ref 6–23)
CHLORIDE: 104 meq/L (ref 96–112)
CO2: 26 meq/L (ref 19–32)
Calcium: 8.7 mg/dL (ref 8.4–10.5)
Creatinine, Ser: 0.8 mg/dL (ref 0.40–1.20)
GFR: 94.81 mL/min (ref >60.00)
GLUCOSE: 85 mg/dL (ref 70–99)
POTASSIUM: 4.3 meq/L (ref 3.5–5.1)
SODIUM: 136 meq/L (ref 135–145)
TOTAL PROTEIN: 7.2 g/dL (ref 6.0–8.3)
Total Bilirubin: 0.7 mg/dL (ref 0.2–1.2)

## 2018-08-18 LAB — CBC
HEMATOCRIT: 38.4 % (ref 36.0–46.0)
HEMOGLOBIN: 12.9 g/dL (ref 12.0–15.0)
MCHC: 33.5 g/dL (ref 30.0–36.0)
MCV: 92.2 fl (ref 78.0–100.0)
PLATELETS: 211 10*3/uL (ref 150.0–400.0)
RBC: 4.17 Mil/uL (ref 3.87–5.11)
RDW: 12.9 % (ref 11.5–15.5)
WBC: 4 10*3/uL (ref 4.0–10.5)

## 2018-08-18 LAB — TSH: TSH: 1.92 u[IU]/mL (ref 0.35–4.50)

## 2018-08-18 LAB — T4, FREE: Free T4: 0.69 ng/dL (ref 0.60–1.60)

## 2018-08-18 MED ORDER — METHIMAZOLE 10 MG PO TABS: 5.0000 mg | ORAL_TABLET | Freq: Every day | ORAL | 3 refills | Status: DC

## 2018-08-18 NOTE — Telephone Encounter (Signed)
Discussed lab results with the patient   Results for DEL, WISEMAN (MRN [id number]) as of 08/18/2018 12:36  Ref. Range 08/18/2018 08:19  TSH Latest Ref Range: 0.35 - 4.50 uIU/mL 1.92  T4,Free(Direct) Latest Ref Range: 0.60 - 1.60 ng/dL 0.69     Recommendations   Decrease Methimazole to 5 mg daily (half tablet)   Abby Nena Jordan, MD  [facility]  [facility] [address] [place], [place] [address] Phone: [phone] FAX: [phone]

## 2018-09-14 ENCOUNTER — Other Ambulatory Visit: Payer: Self-pay

## 2018-09-15 ENCOUNTER — Ambulatory Visit: Payer: BLUE CROSS/BLUE SHIELD | Admitting: Internal Medicine

## 2018-09-15 ENCOUNTER — Other Ambulatory Visit: Payer: Self-pay

## 2018-09-15 VITALS — BP 122/78 | HR 81 | Temp 98.6°F | Ht 66.0 in | Wt 135.2 lb

## 2018-09-15 DIAGNOSIS — E0500 Thyrotoxicosis with diffuse goiter without thyrotoxic crisis or storm: Secondary | ICD-10-CM

## 2018-09-15 LAB — TSH: TSH: 2.85 u[IU]/mL (ref 0.35–4.50)

## 2018-09-15 LAB — T4, FREE: Free T4: 0.67 ng/dL (ref 0.60–1.60)

## 2018-09-15 NOTE — Patient Instructions (Signed)
We recommend that you follow these hyperthyroidism instructions at home:  1) Take Methimazole 5 mg once a day  If you develop severe sore throat with high fevers OR develop unexplained yellowing of your skin, eyes, under your tongue, severe abdominal pain with nausea or vomiting --> then please get evaluated immediately.

## 2018-09-15 NOTE — Progress Notes (Signed)
Name: Deborah Murphy  MRN/ DOB: [id number], [birth year]    Age/ Sex: 43 y.o., female     PCP: Seward Carol, MD   Reason for Endocrinology Evaluation: Hyperthyroidism     Initial Endocrinology Clinic Visit: 06/09/18    PATIENT IDENTIFIER: Deborah Murphy is a 43 y.o., female with a and remarkable past medical history. She has followed with [facility] Endocrinology clinic since June 09, 2018 for consultative assistance with management of her Deborah Murphy' disease.   HISTORICAL SUMMARY: Pt noted left eye changes during the summer, 219 . She saw her ophthalmologist and that's when she was thought to have Graves' disease. She presented to her PCP and TSH confirmed to be low, with normal FT4 and T3.  A thyroid uptake and Scan consistent with Graves' Disease   Methimazole started in 05/2018   S/P tubal ligation 2005  No Fh of thyroid disease SUBJECTIVE:   During last visit (07/21/2018): We continued Methimazole 10 mg daily   Today (09/15/2018):  Deborah Murphy is here for a 6 week follow up on her Graves' Disease.  She has been compliant with methimazole intake.   She continues to have  blurry vision and gritty sensation of both eyes as well as discharge. She has not seen her ophthalmologist since last summer.   Denies local neck symptoms   She denies any weight loss, diarrhea, anxiety or palpitations.     ROS:  As per HPI.   HISTORY:   Past Medical History: Graves' disease Past Surgical History:  Past Surgical History:  Procedure Laterality Date  . CESAREAN SECTION    . TUBAL LIGATION      Social History:  reports that she has never smoked. She has never used smokeless tobacco. She reports that she does not drink alcohol or use drugs.  Family History: family history includes Diabetes in her mother; Hypertension in her mother.   HOME MEDICATIONS: Allergies as of 09/15/2018   No Known Allergies     Medication List       Accurate as of September 15, 2018  4:19 PM. Always use your  most recent med list.        colchicine 0.6 MG tablet Take 0.6 mg by mouth daily. Take 0.6mg  (one tablet) by mouth every 1-2 hours until one of the following occurs: 1.  The pain is gone 2.  The maximum dose has been given ( no more than 3 tabs in 3 hours or 10 tabs in 24 hours) 3.  The side effects outweight the benefits   methimazole 10 MG tablet Commonly known as:  TAPAZOLE Take 0.5 tablets (5 mg total) by mouth daily.   pseudoephedrine-acetaminophen 30-500 MG Tabs tablet Commonly known as:  TYLENOL SINUS Take 1 tablet by mouth every 4 (four) hours as needed. Congestion         OBJECTIVE:   PHYSICAL EXAM: VS: BP 122/78 (BP Location: Left Arm, Patient Position: Sitting, Cuff Size: Normal)   Pulse 81   Temp 98.6 F (37 C)   Ht 5\' 6"  (1.676 m)   Wt 135 lb 3.2 oz (61.3 kg)   SpO2 98%   BMI 21.82 kg/m    EXAM: General: Pt appears well and is in NAD  Eyes, Nose, Throat: Eyes: Mild stare on the left, no lid lag or exophthalmos Dental: Good dentition  Throat: Clear without mass, erythema or exudate  Neck: General: Supple without adenopathy. Thyroid: Thyroid size normal.  No goiter or nodules appreciated. No thyroid bruit.  Lungs:  Clear with good BS bilat with no rales, rhonchi, or wheezes  Heart: Auscultation: RRR.  Abdomen: Normoactive bowel sounds, soft, nontender, without masses or organomegaly palpable  Extremities:  BL LE: No pretibial edema normal ROM and strength.  Mental Status: Judgment, insight: Intact Orientation: Oriented to time, place, and person Memory: Intact for recent and remote events Mood and affect: No depression, anxiety, or agitation     DATA REVIEWED: 04/19/2018  TSH < 0.01 uIU/mL  FT4 0.85 ng/dL  T3 181 ng/dL (71-180)    Results for TEAGHAN, MELROSE (MRN [id number]) as of 09/18/2018 09:28  Ref. Range 06/30/2018 08:23 08/18/2018 08:19 09/15/2018 15:16  TSH Latest Ref Range: 0.35 - 4.50 uIU/mL <0.01 (L) 1.92 2.85  Triiodothyronine (T3)  Latest Ref Range: 76 - 181 ng/dL 128    T4,Free(Direct) Latest Ref Range: 0.60 - 1.60 ng/dL 0.92 0.69 0.67     ASSESSMENT / PLAN / RECOMMENDATIONS:   1.  Hyperthyroidism secondary to Graves' disease:  -She is clinically euthyroid - Repeat TFT's today  -She denies any side effects to methimazole -She denies any local neck symptoms   Medications  Decrease methimazole to 2.5 mg daily Lab in 6 weeks    2.  Graves' disease: -Patient with mild left Graves' orbitopathy, patient is encouraged to continue follow up with ophthalmology as controlling her thyroid does not always impact graves' orbitopathy.    Follow-up in 3 months   Addendum: Discussed results with pt on 4/6 at ~ 11 am.   Signed electronically by: Mack Guise, MD  [facility] End[facility]  [facility] [address] [place], [place] [address] Phone: [phone] FAX: [phone]      CC: Seward Carol, [place] [employer] [address] [place] [address] Phone: [phone]  Fax: [phone]   Return to Endocrinology clinic as below: Future Appointments  Date Time Provider [place]  12/19/2018  3:00 PM Leydi Winstead, Melanie Crazier, MD [facility] None

## 2018-09-18 ENCOUNTER — Telehealth: Payer: Self-pay | Admitting: Internal Medicine

## 2018-09-18 ENCOUNTER — Encounter: Payer: Self-pay | Admitting: Internal Medicine

## 2018-09-18 MED ORDER — METHIMAZOLE 5 MG PO TABS: 2.5000 mg | ORAL_TABLET | Freq: Every day | ORAL | 6 refills | Status: DC

## 2018-09-18 NOTE — Telephone Encounter (Signed)
Patient Returning MD Call

## 2018-09-18 NOTE — Telephone Encounter (Signed)
Left a message for a call back   Abby Jaralla Shamleffer, MD  [facility]  [facility] [address] [place], [place] [address] Phone: [phone] FAX: [phone]  

## 2018-09-26 DIAGNOSIS — B379 Candidiasis, unspecified: Secondary | ICD-10-CM | POA: Diagnosis not present

## 2018-09-26 DIAGNOSIS — E0500 Thyrotoxicosis with diffuse goiter without thyrotoxic crisis or storm: Secondary | ICD-10-CM | POA: Diagnosis not present

## 2018-09-26 DIAGNOSIS — Z131 Encounter for screening for diabetes mellitus: Secondary | ICD-10-CM | POA: Diagnosis not present

## 2018-10-10 DIAGNOSIS — J069 Acute upper respiratory infection, unspecified: Secondary | ICD-10-CM | POA: Diagnosis not present

## 2018-10-30 ENCOUNTER — Other Ambulatory Visit: Payer: Self-pay

## 2018-10-30 ENCOUNTER — Encounter: Payer: Self-pay | Admitting: Internal Medicine

## 2018-10-30 ENCOUNTER — Other Ambulatory Visit (INDEPENDENT_AMBULATORY_CARE_PROVIDER_SITE_OTHER): Payer: BLUE CROSS/BLUE SHIELD

## 2018-10-30 DIAGNOSIS — J40 Bronchitis, not specified as acute or chronic: Secondary | ICD-10-CM | POA: Diagnosis not present

## 2018-10-30 DIAGNOSIS — E0500 Thyrotoxicosis with diffuse goiter without thyrotoxic crisis or storm: Secondary | ICD-10-CM

## 2018-10-30 LAB — T4, FREE: Free T4: 0.76 ng/dL (ref 0.60–1.60)

## 2018-10-30 LAB — TSH: TSH: 2.89 u[IU]/mL (ref 0.35–4.50)

## 2018-10-31 ENCOUNTER — Encounter: Payer: Self-pay | Admitting: Internal Medicine

## 2018-10-31 DIAGNOSIS — H1013 Acute atopic conjunctivitis, bilateral: Secondary | ICD-10-CM | POA: Diagnosis not present

## 2018-11-09 ENCOUNTER — Other Ambulatory Visit: Payer: Self-pay | Admitting: Internal Medicine

## 2018-11-09 ENCOUNTER — Ambulatory Visit
Admission: RE | Admit: 2018-11-09 | Discharge: 2018-11-09 | Disposition: A | Discharge status: Home / Self Care | Payer: BLUE CROSS/BLUE SHIELD | Source: Ambulatory Visit | Attending: Internal Medicine | Admitting: Internal Medicine

## 2018-11-09 DIAGNOSIS — R059 Cough, unspecified: Secondary | ICD-10-CM

## 2018-11-09 DIAGNOSIS — R05 Cough: Secondary | ICD-10-CM | POA: Diagnosis not present

## 2018-12-04 ENCOUNTER — Other Ambulatory Visit: Payer: Self-pay

## 2018-12-04 ENCOUNTER — Ambulatory Visit: Payer: BLUE CROSS/BLUE SHIELD | Admitting: Emergency Medicine

## 2018-12-04 ENCOUNTER — Encounter: Payer: Self-pay | Admitting: Emergency Medicine

## 2018-12-04 DIAGNOSIS — R05 Cough: Secondary | ICD-10-CM

## 2018-12-04 DIAGNOSIS — R053 Chronic cough: Secondary | ICD-10-CM

## 2018-12-04 MED ORDER — BENZONATATE 100 MG PO CAPS: 100.0000 mg | ORAL_CAPSULE | Freq: Four times a day (QID) | ORAL | 1 refills | Status: DC | PRN

## 2018-12-04 NOTE — Patient Instructions (Signed)
Please continue Nexium 40 mg once daily as you have been taking it.  Try to take this 1 hour around food Work on avoiding spicy foods Please start loratadine 10 mg (Claritin) once daily until next time. Try to avoid throat clearing if possible. You may want to try practicing appear to voice rest to decrease strain on your throat and upper airway Try using Tessalon Perles 100 mg up to every 6 hours to suppress cough if you are having flares. Depending on how your cough progresses we will decide next time about other possible testing Follow with Dr Lamonte Sakai in 1 month

## 2018-12-04 NOTE — Progress Notes (Signed)
Subjective:    Patient ID: Deborah Murphy, female    DOB: Sep 22, 1975, 43 y.o.   MRN: [id number]  HPI 43 year old woman, never smoker, with a history of fairly recently diagnosed Graves' disease on methimazole since 05/2018.  History of GERD.  She is referred today for evaluation of cough.  She reports dry cough that became problem about 3 months ago. In retrospect she has had cough before that has happened in the spring, has been treated with abx before, prn anti-histamines. This time, has persisted much longer. Can be worse at night, doesn't wake her. Has been given pred, has been given hydomet. She was more recently started on empiric Nexium - her cough is less frequent. She has some low grade allergy sx, most bothersome during spring months.     CXR 11/09/2018 reviewed by me, no infiltrates or abnormalities.    Review of Systems  Constitutional: Negative for fever and unexpected weight change.  HENT: Negative for congestion, dental problem, ear pain, nosebleeds, postnasal drip, rhinorrhea, sinus pressure, sneezing, sore throat and trouble swallowing.   Eyes: Negative for redness and itching.  Respiratory: Positive for cough. Negative for chest tightness, shortness of breath and wheezing.   Cardiovascular: Negative for palpitations and leg swelling.  Gastrointestinal: Negative for nausea and vomiting.  Genitourinary: Negative for dysuria.  Musculoskeletal: Negative for joint swelling.  Skin: Negative for rash.  Neurological: Negative for headaches.  Hematological: Does not bruise/bleed easily.  Psychiatric/Behavioral: Negative for dysphoric mood. The patient is not nervous/anxious.     No past medical history on file.   Family History  Problem Relation Age of Onset  . Hypertension Mother   . Diabetes Mother      Social History   Socioeconomic History  . Marital status: Single    Spouse name: Not on file  . Number of children: Not on file  . Years of education: Not on file   . Highest education level: Not on file  Occupational History  . Not on file  Social Needs  . Financial resource strain: Not on file  . Food insecurity    Worry: Not on file    Inability: Not on file  . Transportation needs    Medical: Not on file    Non-medical: Not on file  Tobacco Use  . Smoking status: Never Smoker  . Smokeless tobacco: Never Used  Substance and Sexual Activity  . Alcohol use: No  . Drug use: No  . Sexual activity: Yes    Birth control/protection: Surgical  Lifestyle  . Physical activity    Days per week: Not on file    Minutes per session: Not on file  . Stress: Not on file  Relationships  . Social Herbalist on phone: Not on file    Gets together: Not on file    Attends religious service: Not on file    Active member of club or organization: Not on file    Attends meetings of clubs or organizations: Not on file    Relationship status: Not on file  . Intimate partner violence    Fear of current or ex partner: Not on file    Emotionally abused: Not on file    Physically abused: Not on file    Forced sexual activity: Not on file  Other Topics Concern  . Not on file  Social History Narrative  . Not on file     No Known Allergies   Outpatient Medications Prior to  Visit  Medication Sig Dispense Refill  . albuterol (VENTOLIN HFA) 108 (90 Base) MCG/ACT inhaler Inhale 2 puffs into the lungs every 6 (six) hours as needed for wheezing or shortness of breath.    . esomeprazole (NEXIUM) 40 MG capsule Take 40 mg by mouth daily at 12 noon.    . methimazole (TAPAZOLE) 5 MG tablet Take 0.5 tablets (2.5 mg total) by mouth daily. 30 tablet 6  . colchicine 0.6 MG tablet Take 0.6 mg by mouth daily. Take 0.6mg  (one tablet) by mouth every 1-2 hours until one of the following occurs: 1.  The pain is gone 2.  The maximum dose has been given ( no more than 3 tabs in 3 hours or 10 tabs in 24 hours) 3.  The side effects outweight the benefits    .  pseudoephedrine-acetaminophen (TYLENOL SINUS) 30-500 MG TABS Take 1 tablet by mouth every 4 (four) hours as needed. Congestion     No facility-administered medications prior to visit.         Objective:   Physical Exam Vitals:   12/04/18 1148  BP: 108/66  Pulse: 74  SpO2: 100%  Weight: 136 lb (61.7 kg)  Height: 5\' 7"  (1.702 m)   Gen: Pleasant, well-nourished, in no distress,  normal affect  ENT: No lesions,  mouth clear,  oropharynx clear, no postnasal drip  Neck: No JVD, no stridor  Lungs: No use of accessory muscles, no crackles or wheezing on normal respiration, no wheeze on forced expiration  Cardiovascular: RRR, heart sounds normal, no murmur or gallops, no peripheral edema  Musculoskeletal: No deformities, no cyanosis or clubbing  Neuro: alert, awake, non focal  Skin: Warm, no lesions or rash      Assessment & Plan:  Chronic cough She has had short periods of dry cough in the past, often in association with spring allergy season, sometimes treated for bronchitis at these times.  This time her cough has persisted for 3 months.  Based on description I suspect that this is upper airway irritation.  She has been having GERD, appears to be more prominent this year.  She was just started on methimazole which can cause reflux, gastritis, and I suspect that that is the reason why the GERD is been more active.  She just started Nexium about 2 weeks ago and her heartburn, cough have both improved.  The cough is not gone completely.  She has a normal chest x-ray.  I think will be reasonable to continue the Nexium for now, even consider going to twice daily if her GERD breakthrough.  Add loratadine for any low-grade contribution of rhinitis.  Counseled her regarding diet, voice rest, avoiding throat clearing.  We can try to suppress her cough with Tessalon.  If her upper airway irritation resolves with these interventions then no further work-up needed.  If not then I would perform  pulmonary function testing, consider bronchoscopy to visualize her upper airway.  It may be that she will require the Nexium long-term, depending on how long she needs the methimazole.  Please continue Nexium 40 mg once daily as you have been taking it.  Try to take this 1 hour around food Work on avoiding spicy foods Please start loratadine 10 mg (Claritin) once daily until next time. Try to avoid throat clearing if possible. You may want to try practicing appear to voice rest to decrease strain on your throat and upper airway Try using Tessalon Perles 100 mg up to every 6 hours to  suppress cough if you are having flares. Depending on how your cough progresses we will decide next time about other possible testing Follow with Dr Lamonte Sakai in 1 month   Baltazar Apo, MD, PhD 12/04/2018, 12:21 PM [facility] [phone] or if no answer [phone]

## 2018-12-04 NOTE — Assessment & Plan Note (Signed)
She has had short periods of dry cough in the past, often in association with spring allergy season, sometimes treated for bronchitis at these times.  This time her cough has persisted for 3 months.  Based on description I suspect that this is upper airway irritation.  She has been having GERD, appears to be more prominent this year.  She was just started on methimazole which can cause reflux, gastritis, and I suspect that that is the reason why the GERD is been more active.  She just started Nexium about 2 weeks ago and her heartburn, cough have both improved.  The cough is not gone completely.  She has a normal chest x-ray.  I think will be reasonable to continue the Nexium for now, even consider going to twice daily if her GERD breakthrough.  Add loratadine for any low-grade contribution of rhinitis.  Counseled her regarding diet, voice rest, avoiding throat clearing.  We can try to suppress her cough with Tessalon.  If her upper airway irritation resolves with these interventions then no further work-up needed.  If not then I would perform pulmonary function testing, consider bronchoscopy to visualize her upper airway.  It may be that she will require the Nexium long-term, depending on how long she needs the methimazole.  Please continue Nexium 40 mg once daily as you have been taking it.  Try to take this 1 hour around food Work on avoiding spicy foods Please start loratadine 10 mg (Claritin) once daily until next time. Try to avoid throat clearing if possible. You may want to try practicing appear to voice rest to decrease strain on your throat and upper airway Try using Tessalon Perles 100 mg up to every 6 hours to suppress cough if you are having flares. Depending on how your cough progresses we will decide next time about other possible testing Follow with Dr Lamonte Sakai in 1 month

## 2018-12-14 ENCOUNTER — Other Ambulatory Visit: Payer: Self-pay

## 2018-12-19 ENCOUNTER — Encounter: Payer: Self-pay | Admitting: Internal Medicine

## 2018-12-19 ENCOUNTER — Ambulatory Visit: Payer: BC Managed Care – PPO | Admitting: Internal Medicine

## 2018-12-19 ENCOUNTER — Other Ambulatory Visit: Payer: Self-pay

## 2018-12-19 VITALS — BP 124/68 | HR 97 | Temp 98.1°F | Ht 67.0 in | Wt 134.6 lb

## 2018-12-19 DIAGNOSIS — E0500 Thyrotoxicosis with diffuse goiter without thyrotoxic crisis or storm: Secondary | ICD-10-CM

## 2018-12-19 LAB — T4, FREE: Free T4: 0.66 ng/dL (ref 0.60–1.60)

## 2018-12-19 LAB — TSH: TSH: 2.09 u[IU]/mL (ref 0.35–4.50)

## 2018-12-19 NOTE — Patient Instructions (Signed)
We recommend that you follow these hyperthyroidism instructions at home:  1) Take Methimazole 2.5 mg once a day  If you develop severe sore throat with high fevers OR develop unexplained yellowing of your skin, eyes, under your tongue, severe abdominal pain with nausea or vomiting --> then please get evaluated immediately.    

## 2018-12-19 NOTE — Progress Notes (Signed)
Name: Deborah Murphy  MRN/ DOB: [id number], [birth year]    Age/ Sex: 43 y.o., female     PCP: Seward Carol, MD   Reason for Endocrinology Evaluation: Hyperthyroidism     Initial Endocrinology Clinic Visit: 06/09/18    PATIENT IDENTIFIER: Deborah Murphy is a 43 y.o., female with a and remarkable past medical history. She has followed with [facility] Endocrinology clinic since June 09, 2018 for consultative assistance with management of her Deborah Murphy' disease.   HISTORICAL SUMMARY: Pt noted left eye changes during the summer, 219 . She saw her ophthalmologist and that's when she was thought to have Graves' disease. She presented to her PCP and TSH confirmed to be low, with normal FT4 and T3.  A thyroid uptake and Scan consistent with Graves' Disease   Methimazole started in 05/2018   S/P tubal ligation 2005  No Fh of thyroid disease SUBJECTIVE:   During last visit (09/15/2018): We decreased Methimazole to 2.5 mg daily   Today (12/20/2018):  [name] is here for a 3 month follow up on her Graves' Disease.  She has been compliant with methimazole intake. She denies any side effects.   She denies any heat intolerance, diarrhea or palpitations Weight has been stable   Denies any local neck symptoms.   She continues to follow up with an  ophthalmologist for her left eye orbitopathy    ROS:  As per HPI.   HISTORY:   Past Medical History: Graves' disease Past Surgical History:  Past Surgical History:  Procedure Laterality Date  . CESAREAN SECTION    . TUBAL LIGATION      Social History:  reports that she has never smoked. She has never used smokeless tobacco. She reports that she does not drink alcohol or use drugs.  Family History: family history includes Diabetes in her mother; Hypertension in her mother.   HOME MEDICATIONS: Allergies as of 12/19/2018   No Known Allergies     Medication List       Accurate as of December 19, 2018 11:59 PM. If you have any questions,  ask your nurse or doctor.        albuterol 108 (90 Base) MCG/ACT inhaler Commonly known as: VENTOLIN HFA Inhale 2 puffs into the lungs every 6 (six) hours as needed for wheezing or shortness of breath.   benzonatate 100 MG capsule Commonly known as: TESSALON Take 1 capsule (100 mg total) by mouth every 6 (six) hours as needed for cough.   esomeprazole 40 MG capsule Commonly known as: NEXIUM Take 40 mg by mouth daily at 12 noon.   methimazole 5 MG tablet Commonly known as: TAPAZOLE Take 0.5 tablets (2.5 mg total) by mouth daily.         OBJECTIVE:   PHYSICAL EXAM: VS: BP 124/68 (BP Location: Left Arm, Patient Position: Sitting, Cuff Size: Normal)   Pulse 97   Temp 98.1 F (36.7 C)   Ht 5\' 7"  (1.702 m)   Wt 134 lb 9.6 oz (61.1 kg)   SpO2 99%   BMI 21.08 kg/m    EXAM: General: Pt appears well and is in NAD  Eyes Eyes: Mild stare on the left, no lid lag or exophthalmos   Neck: General: Supple without adenopathy. Thyroid: Thyroid size normal.  No goiter or nodules appreciated. No thyroid bruit.  Lungs: Clear with good BS bilat with no rales, rhonchi, or wheezes  Heart: Auscultation: RRR.  Abdomen: Normoactive bowel sounds, soft, nontender, without masses or organomegaly palpable  Extremities:  BL LE: No pretibial edema normal ROM and strength.  Mental Status: Judgment, insight: Intact Orientation: Oriented to time, place, and person Mood and affect: No depression, anxiety, or agitation     DATA REVIEWED: 04/19/2018  TSH < 0.01 uIU/mL  FT4 0.85 ng/dL  T3 181 ng/dL (71-180)     Results for ABELLA, SHUGART (MRN [id number]) as of 12/20/2018 08:22  Ref. Range 12/19/2018 15:31  TSH Latest Ref Range: 0.35 - 4.50 uIU/mL 2.09  T4,Free(Direct) Latest Ref Range: 0.60 - 1.60 ng/dL 0.66      ASSESSMENT / PLAN / RECOMMENDATIONS:   1.  Hyperthyroidism Secondary to Graves' disease:  -She is clinically euthyroid  -She denies any side effects to methimazole -She  denies any local neck symptoms - TSH steady at 2.09 uIU/mL , will continue current dose of methiamzole    Medications  Continue Methimazole  2.5 mg daily Lab in 6 weeks    2.  Graves' disease:  -Patient with mild left Graves' orbitopathy, patient is encouraged to continue follow up with ophthalmology as controlling her thyroid does not always impact graves' orbitopathy.       Follow-up in 3 months     Signed electronically by: Mack Guise, MD  [facility] Endocrin[facility]  [facility] [address] [place], [place] [address] Phone: [phone] FAX: [phone]      CC: Seward Carol, [place] [address] [employer] [facility] [employer] [address] [place] [address] Phone: [phone]  Fax: [phone]   Return to Endocrinology clinic as below: Future Appointments  Date Time Provider [place]  03/21/2019  1:00 PM Shamleffer, Melanie Crazier, MD [facility] None

## 2018-12-20 ENCOUNTER — Encounter: Payer: Self-pay | Admitting: Internal Medicine

## 2019-01-23 ENCOUNTER — Ambulatory Visit: Payer: BC Managed Care – PPO | Admitting: Emergency Medicine

## 2019-01-23 ENCOUNTER — Encounter: Payer: Self-pay | Admitting: Emergency Medicine

## 2019-01-23 ENCOUNTER — Other Ambulatory Visit: Payer: Self-pay

## 2019-01-23 DIAGNOSIS — R05 Cough: Secondary | ICD-10-CM | POA: Diagnosis not present

## 2019-01-23 DIAGNOSIS — R053 Chronic cough: Secondary | ICD-10-CM

## 2019-01-23 MED ORDER — BENZONATATE 200 MG PO CAPS: 200.0000 mg | ORAL_CAPSULE | Freq: Three times a day (TID) | ORAL | 1 refills | Status: AC | PRN

## 2019-01-23 NOTE — Patient Instructions (Signed)
Please try decreasing her Nexium to 40 mg once daily.  Take this medication about 1 hour around food. Try to avoid throat clearing if possible Keep Tessalon Perles available to use if you need them for cough suppression. Please call our office and follow-up if you have any worsening of cough, new breathing symptoms.  If so then we may decide to do additional work-up and evaluation.

## 2019-01-23 NOTE — Progress Notes (Signed)
Subjective:    Patient ID: Deborah Murphy, female    DOB: 04-10-1976, 43 y.o.   MRN: [id number]  HPI 43 year old woman, never smoker, with a history of fairly recently diagnosed Graves' disease on methimazole since 05/2018.  History of GERD.  She is referred today for evaluation of cough.  She reports dry cough that became problem about 3 months ago. In retrospect she has had cough before that has happened in the spring, has been treated with abx before, prn anti-histamines. This time, has persisted much longer. Can be worse at night, doesn't wake her. Has been given pred, has been given hydomet. She was more recently started on empiric Nexium - her cough is less frequent. She has some low grade allergy sx, most bothersome during spring months.   CXR 11/09/2018 reviewed by me, no infiltrates or abnormalities.   ROV 01/23/2019 --this a follow-up visit for chronic cough in a person with no smoking history.  Based on her previous evaluations felt that it may be most related to esophageal reflux.  She had been started on Nexium 40 mg and I increased to bid just before I met her.  I recommended loratadine but she did not fill, and Tessalon for cough suppression.  It has been 6 weeks and she reports that she is doing much better - still happens rarely. She did use the tessalon.     Review of Systems  Constitutional: Negative for fever and unexpected weight change.  HENT: Negative for congestion, dental problem, ear pain, nosebleeds, postnasal drip, rhinorrhea, sinus pressure, sneezing, sore throat and trouble swallowing.   Eyes: Negative for redness and itching.  Respiratory: Positive for cough. Negative for chest tightness, shortness of breath and wheezing.   Cardiovascular: Negative for palpitations and leg swelling.  Gastrointestinal: Negative for nausea and vomiting.  Genitourinary: Negative for dysuria.  Musculoskeletal: Negative for joint swelling.  Skin: Negative for rash.  Neurological:  Negative for headaches.  Hematological: Does not bruise/bleed easily.  Psychiatric/Behavioral: Negative for dysphoric mood. The patient is not nervous/anxious.     No past medical history on file.   Family History  Problem Relation Age of Onset  . Hypertension Mother   . Diabetes Mother      Social History   Socioeconomic History  . Marital status: Single    Spouse name: Not on file  . Number of children: Not on file  . Years of education: Not on file  . Highest education level: Not on file  Occupational History  . Not on file  Social Needs  . Financial resource strain: Not on file  . Food insecurity    Worry: Not on file    Inability: Not on file  . Transportation needs    Medical: Not on file    Non-medical: Not on file  Tobacco Use  . Smoking status: Never Smoker  . Smokeless tobacco: Never Used  Substance and Sexual Activity  . Alcohol use: No  . Drug use: No  . Sexual activity: Yes    Birth control/protection: Surgical  Lifestyle  . Physical activity    Days per week: Not on file    Minutes per session: Not on file  . Stress: Not on file  Relationships  . Social Herbalist on phone: Not on file    Gets together: Not on file    Attends religious service: Not on file    Active member of club or organization: Not on file  Attends meetings of clubs or organizations: Not on file    Relationship status: Not on file  . Intimate partner violence    Fear of current or ex partner: Not on file    Emotionally abused: Not on file    Physically abused: Not on file    Forced sexual activity: Not on file  Other Topics Concern  . Not on file  Social History Narrative  . Not on file     No Known Allergies   Outpatient Medications Prior to Visit  Medication Sig Dispense Refill  . albuterol (VENTOLIN HFA) 108 (90 Base) MCG/ACT inhaler Inhale 2 puffs into the lungs every 6 (six) hours as needed for wheezing or shortness of breath.    . esomeprazole  (NEXIUM) 40 MG capsule Take 40 mg by mouth daily at 12 noon.    . methimazole (TAPAZOLE) 5 MG tablet Take 0.5 tablets (2.5 mg total) by mouth daily. 30 tablet 6  . benzonatate (TESSALON) 100 MG capsule Take 1 capsule (100 mg total) by mouth every 6 (six) hours as needed for cough. 30 capsule 1   No facility-administered medications prior to visit.         Objective:   Physical Exam Vitals:   01/23/19 1451  BP: 116/74  Pulse: 76  SpO2: 100%  Weight: 138 lb (62.6 kg)  Height: '5\' 6"'  (1.676 m)   Gen: Pleasant, well-nourished, in no distress,  normal affect  ENT: No lesions,  mouth clear,  oropharynx clear, no postnasal drip  Neck: No JVD, no stridor  Lungs: No use of accessory muscles, no crackles or wheezing on normal respiration, no wheeze on forced expiration  Cardiovascular: RRR, heart sounds normal, no murmur or gallops, no peripheral edema  Musculoskeletal: No deformities, no cyanosis or clubbing  Neuro: alert, awake, non focal  Skin: Warm, no lesions or rash      Assessment & Plan:  Chronic cough Her cough has continued to improve with the increase in her Nexium to twice a day.  Now that she has a modified diet, her upper airway irritation is improved I think we can try going back to once daily.  I will give her a prescription for Tessalon for cough suppression in case she needs it.  If her cough returns, worsens then she may need further work-up including PFT, bronchoscopy, other imaging.  Please try decreasing her Nexium to 40 mg once daily.  Take this medication about 1 hour around food. Try to avoid throat clearing if possible Keep Tessalon Perles available to use if you need them for cough suppression. Please call our office and follow-up if you have any worsening of cough, new breathing symptoms.  If so then we may decide to do additional work-up and evaluation.   Baltazar Apo, MD, PhD 01/23/2019, 3:19 PM [facility] [phone] or if  no answer [phone]

## 2019-01-23 NOTE — Assessment & Plan Note (Signed)
Her cough has continued to improve with the increase in her Nexium to twice a day.  Now that she has a modified diet, her upper airway irritation is improved I think we can try going back to once daily.  I will give her a prescription for Tessalon for cough suppression in case she needs it.  If her cough returns, worsens then she may need further work-up including PFT, bronchoscopy, other imaging.  Please try decreasing her Nexium to 40 mg once daily.  Take this medication about 1 hour around food. Try to avoid throat clearing if possible Keep Tessalon Perles available to use if you need them for cough suppression. Please call our office and follow-up if you have any worsening of cough, new breathing symptoms.  If so then we may decide to do additional work-up and evaluation.

## 2019-02-06 DIAGNOSIS — Z6822 Body mass index (BMI) 22.0-22.9, adult: Secondary | ICD-10-CM | POA: Diagnosis not present

## 2019-02-06 DIAGNOSIS — Z202 Contact with and (suspected) exposure to infections with a predominantly sexual mode of transmission: Secondary | ICD-10-CM | POA: Diagnosis not present

## 2019-02-06 DIAGNOSIS — Z113 Encounter for screening for infections with a predominantly sexual mode of transmission: Secondary | ICD-10-CM | POA: Diagnosis not present

## 2019-02-06 DIAGNOSIS — Z124 Encounter for screening for malignant neoplasm of cervix: Secondary | ICD-10-CM | POA: Diagnosis not present

## 2019-02-06 DIAGNOSIS — Z01419 Encounter for gynecological examination (general) (routine) without abnormal findings: Secondary | ICD-10-CM | POA: Diagnosis not present

## 2019-02-06 DIAGNOSIS — Z1231 Encounter for screening mammogram for malignant neoplasm of breast: Secondary | ICD-10-CM | POA: Diagnosis not present

## 2019-02-28 DIAGNOSIS — D259 Leiomyoma of uterus, unspecified: Secondary | ICD-10-CM | POA: Diagnosis not present

## 2019-02-28 DIAGNOSIS — Z6822 Body mass index (BMI) 22.0-22.9, adult: Secondary | ICD-10-CM | POA: Diagnosis not present

## 2019-03-19 ENCOUNTER — Other Ambulatory Visit: Payer: Self-pay

## 2019-03-21 ENCOUNTER — Ambulatory Visit: Payer: BC Managed Care – PPO | Admitting: Internal Medicine

## 2019-03-21 ENCOUNTER — Other Ambulatory Visit: Payer: Self-pay

## 2019-03-21 ENCOUNTER — Encounter: Payer: Self-pay | Admitting: Internal Medicine

## 2019-03-21 VITALS — BP 118/60 | HR 79 | Temp 98.3°F | Ht 66.0 in | Wt 139.0 lb

## 2019-03-21 DIAGNOSIS — E0500 Thyrotoxicosis with diffuse goiter without thyrotoxic crisis or storm: Secondary | ICD-10-CM | POA: Diagnosis not present

## 2019-03-21 LAB — TSH: TSH: 2.46 u[IU]/mL (ref 0.35–4.50)

## 2019-03-21 LAB — T4, FREE: Free T4: 0.88 ng/dL (ref 0.60–1.60)

## 2019-03-21 NOTE — Progress Notes (Signed)
Name: Deborah Murphy  MRN/ DOB: [id number], September 09, 1975    Age/ Sex: 43 y.o., female     PCP: Seward Carol, MD   Reason for Endocrinology Evaluation: Hyperthyroidism     Initial Endocrinology Clinic Visit: 06/09/18    PATIENT IDENTIFIER: [name] is a 43 y.o., female with a and remarkable past medical history. She has followed with [facility] Endocrinology clinic since June 09, 2018 for consultative assistance with management of her Berenice Primas' disease.   HISTORICAL SUMMARY: Pt noted left eye changes during the summer, 219 . She saw her ophthalmologist and that's when she was thought to have Graves' disease. She presented to her PCP and TSH confirmed to be low, with normal FT4 and T3.  A thyroid uptake and Scan consistent with Graves' Disease   Methimazole started in 05/2018   S/P tubal ligation 2005  No Fh of thyroid disease SUBJECTIVE:   During last visit (12/19/2018): We continued  Methimazole to 2.5 mg daily   Today (03/21/2019):  [name] is here for a 3 month follow up on her Graves' Disease.  She has been compliant with methimazole intake. She denies any side effects. She has been on 2.5 mg since April.   She denies any heat intolerance, diarrhea or palpitations Weight has been stable   Denies any local neck symptoms.    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 But last week while getting out of the shower she noted light flashes in both eyes and has a follow up next week with her general optometrists.   ROS:  As per HPI.   HISTORY:   Past Medical History: Graves' disease Past Surgical History:  Past Surgical History:  Procedure Laterality Date  . CESAREAN SECTION    . TUBAL LIGATION      Social History:  reports that she has never smoked. She has never used smokeless tobacco. She reports that she does not drink alcohol or use drugs.  Family History: family history includes Diabetes in her mother; Hypertension in her  mother.   HOME MEDICATIONS: Allergies as of 03/21/2019   No Known Allergies     Medication List       Accurate as of March 21, 2019  1:28 PM. If you have any questions, ask your nurse or doctor.        albuterol 108 (90 Base) MCG/ACT inhaler Commonly known as: VENTOLIN HFA Inhale 2 puffs into the lungs every 6 (six) hours as needed for wheezing or shortness of breath.   benzonatate 200 MG capsule Commonly known as: TESSALON Take 1 capsule (200 mg total) by mouth 3 (three) times daily as needed for cough.   esomeprazole 40 MG capsule Commonly known as: NEXIUM Take 40 mg by mouth daily at 12 noon.   methimazole 5 MG tablet Commonly known as: TAPAZOLE Take 0.5 tablets (2.5 mg total) by mouth daily.         OBJECTIVE:   PHYSICAL EXAM: VS: BP 118/60 (BP Location: Left Arm, Patient Position: Sitting, Cuff Size: Normal)   Pulse 79   Temp 98.3 F (36.8 C)   Ht 5\' 6"  (1.676 m)   Wt 139 lb (63 kg)   SpO2 99%   BMI 22.44 kg/m    EXAM: General: Pt appears well and is in NAD  Eyes Eyes: Decreased left stare, no lid lag or exophthalmos   Neck: General: Supple without adenopathy. Thyroid: Thyroid size normal.  No goiter or nodules appreciated. No  thyroid bruit.  Lungs: Clear with good BS bilat with no rales, rhonchi, or wheezes  Heart: Auscultation: RRR.  Abdomen: Normoactive bowel sounds, soft, nontender, without masses or organomegaly palpable  Extremities:  BL LE: No pretibial edema normal ROM and strength.  Mental Status: Judgment, insight: Intact Orientation: Oriented to time, place, and person Mood and affect: No depression, anxiety, or agitation     DATA REVIEWED: 04/19/2018  TSH < 0.01 uIU/mL  FT4 0.85 ng/dL  T3 181 ng/dL (71-180)     Results for MAKIYA, BEAMESDERFER (MRN [id number]) as of 12/20/2018 08:22  Ref. Range 12/19/2018 15:31  TSH Latest Ref Range: 0.35 - 4.50 uIU/mL 2.09  T4,Free(Direct) Latest Ref Range: 0.60 - 1.60 ng/dL 0.66       ASSESSMENT / PLAN / RECOMMENDATIONS:   1.  Hyperthyroidism Secondary to Graves' disease:  -She is clinically euthyroid  -She denies any side effects to methimazole -She denies any local neck symptoms    Medications  Continue Methimazole  2.5 mg daily Lab in 8 weeks    2.  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  - She already has been using artifical tears - She was advised to use selenium      Follow-up in 4 months     Signed electronically by: Mack Guise, MD  [facility]  [address] [place], [place] [address] Phone: [phone] FAX: [phone]      CC: Seward Carol, [address] [place] [address] Phone: [phone]  Fax: [phone]   Return to Endocrinology clinic as below: Future Appointments  Date Time Provider [place]  05/16/2019  8:00 AM [facility] None  07/24/2019  2:00 PM , Melanie Crazier, MD [facility] None

## 2019-03-21 NOTE — Patient Instructions (Signed)
We recommend that you follow these hyperthyroidism instructions at home:  1) Take Methimazole 2.5 mg once a day  If you develop severe sore throat with high fevers OR develop unexplained yellowing of your skin, eyes, under your tongue, severe abdominal pain with nausea or vomiting --> then please get evaluated immediately.    

## 2019-03-22 ENCOUNTER — Telehealth: Payer: Self-pay | Admitting: Internal Medicine

## 2019-03-22 NOTE — Telephone Encounter (Signed)
Lolita Rieger D, CMA  Nichele Slawson, Melanie Crazier, MD        Please send these as a lab result or a phone message so that it will be in pt chart, pt aware of results.   Previous Messages  ----- Message -----  From: Cloyd Stagers, MD  Sent: 03/22/2019  8:55 AM EDT  To: Jeannetta Nap, CMA   Please let her know her thyroid looks good and to continue taking half a tablet of methimazole.    Thanks

## 2019-03-26 ENCOUNTER — Other Ambulatory Visit: Payer: Self-pay

## 2019-03-26 DIAGNOSIS — Z20828 Contact with and (suspected) exposure to other viral communicable diseases: Secondary | ICD-10-CM | POA: Diagnosis not present

## 2019-03-26 DIAGNOSIS — Z20822 Contact with and (suspected) exposure to covid-19: Secondary | ICD-10-CM

## 2019-03-27 LAB — NOVEL CORONAVIRUS, NAA: SARS-CoV-2, NAA: NOT DETECTED

## 2019-03-30 DIAGNOSIS — Z6822 Body mass index (BMI) 22.0-22.9, adult: Secondary | ICD-10-CM | POA: Diagnosis not present

## 2019-03-30 DIAGNOSIS — D259 Leiomyoma of uterus, unspecified: Secondary | ICD-10-CM | POA: Diagnosis not present

## 2019-04-26 DIAGNOSIS — H5213 Myopia, bilateral: Secondary | ICD-10-CM | POA: Diagnosis not present

## 2019-04-26 DIAGNOSIS — H10413 Chronic giant papillary conjunctivitis, bilateral: Secondary | ICD-10-CM | POA: Diagnosis not present

## 2019-05-09 ENCOUNTER — Other Ambulatory Visit: Payer: Self-pay | Admitting: Obstetrics and Gynecology

## 2019-05-16 ENCOUNTER — Other Ambulatory Visit (INDEPENDENT_AMBULATORY_CARE_PROVIDER_SITE_OTHER): Payer: BC Managed Care – PPO

## 2019-05-16 ENCOUNTER — Other Ambulatory Visit: Payer: Self-pay

## 2019-05-16 DIAGNOSIS — E0500 Thyrotoxicosis with diffuse goiter without thyrotoxic crisis or storm: Secondary | ICD-10-CM | POA: Diagnosis not present

## 2019-05-16 LAB — T4, FREE: Free T4: 0.81 ng/dL (ref 0.60–1.60)

## 2019-05-16 LAB — TSH: TSH: 1.88 u[IU]/mL (ref 0.35–4.50)

## 2019-05-22 ENCOUNTER — Other Ambulatory Visit (HOSPITAL_COMMUNITY)
Admission: RE | Admit: 2019-05-22 | Discharge: 2019-05-22 | Disposition: A | Discharge status: Home / Self Care | Payer: BC Managed Care – PPO | Source: Ambulatory Visit | Attending: Obstetrics and Gynecology | Admitting: Obstetrics and Gynecology

## 2019-05-22 DIAGNOSIS — Z20828 Contact with and (suspected) exposure to other viral communicable diseases: Secondary | ICD-10-CM | POA: Insufficient documentation

## 2019-05-22 DIAGNOSIS — Z01812 Encounter for preprocedural laboratory examination: Secondary | ICD-10-CM | POA: Diagnosis not present

## 2019-05-22 NOTE — Patient Instructions (Signed)
YOU HAD A COVID TEST  05-22-2019 . ONCE YOUR COVID TEST IS DONE PLEASE FOLLOW ALL THE QUARANTINE  INSTRUCTIONS GIVEN IN YOUR HANDOUT.      Your procedure is scheduled on 05-25-2019  Report to [place]. M.   Call this number if you have problems the morning of surgery  [phone].   OUR ADDRESS IS [address].  WE ARE LOCATED IN [facility].                                     REMEMBER:  DO NOT EAT FOOD OR DRINK LIQUIDS AFTER MIDNIGHT .  YOU MAY  BRUSH YOUR TEETH MORNING OF SURGERY AND RINSE YOUR MOUTH OUT, NO CHEWING GUM CANDY OR MINTS.   TAKE THESE MEDICATIONS MORNING OF SURGERY WITH A SIP OF WATER: VENTOLIN INHALER IF NEEDED AND BRING INHALER,  METHIMAZOLE (TAPAZOLE), OMEPRAZOLE ([place]).  IF YOU ARE SPENDING THE NIGHT AFTER SURGERY PLEASE BRING ALL YOUR PRESCRIPTION MEDICATIONS IN THEIR ORIGINAL BOTTLES. 1 VISITOR IS ALLOWED IN WAITING ROOM ONLY DAY OF SURGERY. NO VISITOR MAY SPEND THE NIGHT. VISITOR ARE ALLOWED TO STAY UNTIL 800 PM.                                    DO NOT WEAR JEWERLY, MAKE UP, OR NAIL POLISH ON FINGERNAILS. DO NOT WEAR LOTIONS, POWDERS, PERFUMES OR DEODORANT. DO NOT SHAVE FOR 24 HOURS PRIOR TO DAY OF SURGERY. MEN MAY SHAVE FACE AND NECK. CONTACTS, GLASSES, OR DENTURES MAY NOT BE WORN TO SURGERY.                                    [facility] IS NOT RESPONSIBLE  FOR ANY BELONGINGS.                                                                    Marland Kitchen                                                                                                    [facility] - Preparing for Surgery Before surgery, you can play an important role.  Because skin is not sterile, your skin needs to be as free of germs as possible.  You can reduce the number of germs on your skin by washing with CHG (chlorahexidine gluconate) soap before surgery.  CHG is an antiseptic cleaner which kills germs and bonds with the skin to  continue killing germs even after washing. Please DO NOT use if you have an allergy to CHG or antibacterial  soaps.  If your skin becomes reddened/irritated stop using the CHG and inform your nurse when you arrive at Short Stay. Do not shave (including legs and underarms) for at least 48 hours prior to the first CHG shower.  You may shave your face/neck. Please follow these instructions carefully:  1.  Shower with CHG Soap the night before surgery and the  morning of Surgery.  2.  If you choose to wash your hair, wash your hair first as usual with your  normal  shampoo.  3.  After you shampoo, rinse your hair and body thoroughly to remove the  shampoo.                           4.  Use CHG as you would any other liquid soap.  You can apply chg directly  to the skin and wash                       Gently with a scrungie or clean washcloth.  5.  Apply the CHG Soap to your body ONLY FROM THE NECK DOWN.   Do not use on face/ open                           Wound or open sores. Avoid contact with eyes, ears mouth and genitals (private parts).                       Wash face,  Genitals (private parts) with your normal soap.             6.  Wash thoroughly, paying special attention to the area where your surgery  will be performed.  7.  Thoroughly rinse your body with warm water from the neck down.  8.  DO NOT shower/wash with your normal soap after using and rinsing off  the CHG Soap.                9.  Pat yourself dry with a clean towel.            10.  Wear clean pajamas.            11.  Place clean sheets on your bed the night of your first shower and do not  sleep with pets. Day of Surgery : Do not apply any lotions/deodorants the morning of surgery.  Please wear clean clothes to the hospital/surgery center.  FAILURE TO FOLLOW THESE INSTRUCTIONS MAY RESULT IN THE CANCELLATION OF YOUR SURGERY PATIENT SIGNATURE_________________________________  NURSE  SIGNATURE__________________________________  ________________________________________________________________________  WHAT IS A BLOOD TRANSFUSION? Blood Transfusion Information  A transfusion is the replacement of blood or some of its parts. Blood is made up of multiple cells which provide different functions.  Red blood cells carry oxygen and are used for blood loss replacement.  White blood cells fight against infection.  Platelets control bleeding.  Plasma helps clot blood.  Other blood products are available for specialized needs, such as hemophilia or other clotting disorders. BEFORE THE TRANSFUSION  Who gives blood for transfusions?   Healthy volunteers who are fully evaluated to make sure their blood is safe. This is blood bank blood. Transfusion therapy is the safest it has ever been in the practice of medicine. Before blood is taken from a donor, a complete history is taken to make sure that person has no history of diseases  nor engages in risky social behavior (examples are intravenous drug use or sexual activity with multiple partners). The donor's travel history is screened to minimize risk of transmitting infections, such as malaria. The donated blood is tested for signs of infectious diseases, such as HIV and hepatitis. The blood is then tested to be sure it is compatible with you in order to minimize the chance of a transfusion reaction. If you or a relative donates blood, this is often done in anticipation of surgery and is not appropriate for emergency situations. It takes many days to process the donated blood. RISKS AND COMPLICATIONS Although transfusion therapy is very safe and saves many lives, the main dangers of transfusion include:   Getting an infectious disease.  Developing a transfusion reaction. This is an allergic reaction to something in the blood you were given. Every precaution is taken to prevent this. The decision to have a blood transfusion has been  considered carefully by your caregiver before blood is given. Blood is not given unless the benefits outweigh the risks. AFTER THE TRANSFUSION  Right after receiving a blood transfusion, you will usually feel much better and more energetic. This is especially true if your red blood cells have gotten low (anemic). The transfusion raises the level of the red blood cells which carry oxygen, and this usually causes an energy increase.  The nurse administering the transfusion will monitor you carefully for complications. HOME CARE INSTRUCTIONS  No special instructions are needed after a transfusion. You may find your energy is better. Speak with your caregiver about any limitations on activity for underlying diseases you may have. SEEK MEDICAL CARE IF:   Your condition is not improving after your transfusion.  You develop redness or irritation at the intravenous (IV) site. SEEK IMMEDIATE MEDICAL CARE IF:  Any of the following symptoms occur over the next 12 hours:  Shaking chills.  You have a temperature by mouth above 102 F (38.9 C), not controlled by medicine.  Chest, back, or muscle pain.  People around you feel you are not acting correctly or are confused.  Shortness of breath or difficulty breathing.  Dizziness and fainting.  You get a rash or develop hives.  You have a decrease in urine output.  Your urine turns a dark color or changes to pink, red, or [place]. Any of the following symptoms occur over the next 10 days:  You have a temperature by mouth above 102 F (38.9 C), not controlled by medicine.  Shortness of breath.  Weakness after normal activity.  The white part of the eye turns yellow (jaundice).  You have a decrease in the amount of urine or are urinating less often.  Your urine turns a dark color or changes to pink, red, or [place]. Document Released: 05/28/2000 Document Revised: 08/23/2011 Document Reviewed: 01/15/2008 [facility] Patient Information 2014  [place], [place].  _______________________________________________________________________

## 2019-05-23 ENCOUNTER — Other Ambulatory Visit: Payer: Self-pay

## 2019-05-23 ENCOUNTER — Encounter (HOSPITAL_COMMUNITY)
Admission: RE | Admit: 2019-05-23 | Discharge: 2019-05-23 | Disposition: A | Discharge status: Home / Self Care | Payer: BC Managed Care – PPO | Source: Ambulatory Visit | Attending: Obstetrics and Gynecology | Admitting: Obstetrics and Gynecology

## 2019-05-23 ENCOUNTER — Encounter (HOSPITAL_COMMUNITY): Payer: Self-pay

## 2019-05-23 DIAGNOSIS — N921 Excessive and frequent menstruation with irregular cycle: Secondary | ICD-10-CM | POA: Insufficient documentation

## 2019-05-23 DIAGNOSIS — Z01812 Encounter for preprocedural laboratory examination: Secondary | ICD-10-CM | POA: Insufficient documentation

## 2019-05-23 DIAGNOSIS — N946 Dysmenorrhea, unspecified: Secondary | ICD-10-CM | POA: Insufficient documentation

## 2019-05-23 HISTORY — DX: Thyrotoxicosis with diffuse goiter without thyrotoxic crisis or storm: E05.00

## 2019-05-23 HISTORY — DX: Other specified postprocedural states: Z98.890

## 2019-05-23 LAB — CBC
HCT: 37.5 % (ref 36.0–46.0)
Hemoglobin: 12 g/dL (ref 12.0–15.0)
MCH: 30.9 pg (ref 26.0–34.0)
MCHC: 32 g/dL (ref 30.0–36.0)
MCV: 96.6 fL (ref 80.0–100.0)
Platelets: 207 10*3/uL (ref 150–400)
RBC: 3.88 MIL/uL (ref 3.87–5.11)
RDW: 11.9 % (ref 11.5–15.5)
WBC: 5 10*3/uL (ref 4.0–10.5)
nRBC: 0 % (ref 0.0–0.2)

## 2019-05-23 LAB — ABO/RH: ABO/RH(D): A POS

## 2019-05-23 LAB — NOVEL CORONAVIRUS, NAA (HOSP ORDER, SEND-OUT TO REF LAB; TAT 18-24 HRS): SARS-CoV-2, NAA: NOT DETECTED

## 2019-05-24 ENCOUNTER — Encounter (HOSPITAL_COMMUNITY): Payer: Self-pay

## 2019-05-24 NOTE — H&P (Signed)
43 y.o. No obstetric history on file. complains of severe dysmenorrhea despite ablation.   Previously:"[facility] pt.; Pt. here for consult for hysterectomy; has hx of fibroids; last [place] 2016; Pt. c/o painful and irregular cycles; s/p Ablation/tb//  [place] from 2016: TVUS: Mid position uterus, endometrium 7.8 mm, homogenous in appearance. Anterior intramural fibroid measures 2 cm. Adnexa WNL bilaterally. No free fluid.  S/p ablation. Starting to have very bad cramps with periods. Still having cycles for 4 days and then [place] d/c for a week to two weeks. Cramps are just before cycle starts. Uses aleve and ibuprofen. Sometimes so bad needs to miss work.  S/p BTL. C/S x3. Not told had endometriosis but had slightly enlarged uterus. Hyperthyroid, under control. No PID, no fmhx of ovarian ca. "  Recent US: 11x5x4; EM 6.75mm; small fibroid 1.3 cm but endometrium appears to be invading myometrium anteriorly. Ovaries normal and no ff. Paraovarian cyst on R 1.5 cm.  Past Medical History:  Diagnosis Date  . Graves disease   . S/P endometrial ablation    Past Surgical History:  Procedure Laterality Date  . CESAREAN SECTION    . TUBAL LIGATION      Social History   Socioeconomic History  . Marital status: Single    Spouse name: Not on file  . Number of children: Not on file  . Years of education: Not on file  . Highest education level: Not on file  Occupational History  . Not on file  Tobacco Use  . Smoking status: Never Smoker  . Smokeless tobacco: Never Used  Substance and Sexual Activity  . Alcohol use: No  . Drug use: No  . Sexual activity: Yes    Birth control/protection: Surgical  Other Topics Concern  . Not on file  Social History Narrative  . Not on file   Social Determinants of Health   Financial Resource Strain:   . Difficulty of Paying Living Expenses: Not on file  Food Insecurity:   . Worried About Charity fundraiser in the Last Year: Not on file  . Ran Out of Food in the Last  Year: Not on file  Transportation Needs:   . Lack of Transportation (Medical): Not on file  . Lack of Transportation (Non-Medical): Not on file  Physical Activity:   . Days of Exercise per Week: Not on file  . Minutes of Exercise per Session: Not on file  Stress:   . Feeling of Stress : Not on file  Social Connections:   . Frequency of Communication with Friends and Family: Not on file  . Frequency of Social Gatherings with Friends and Family: Not on file  . Attends Religious Services: Not on file  . Active Member of Clubs or Organizations: Not on file  . Attends Archivist Meetings: Not on file  . Marital Status: Not on file  Intimate Partner Violence:   . Fear of Current or Ex-Partner: Not on file  . Emotionally Abused: Not on file  . Physically Abused: Not on file  . Sexually Abused: Not on file    No current facility-administered medications on file prior to encounter.   Current Outpatient Medications on File Prior to Encounter  Medication Sig Dispense Refill  . albuterol (VENTOLIN HFA) 108 (90 Base) MCG/ACT inhaler Inhale 2 puffs into the lungs every 6 (six) hours as needed for wheezing or shortness of breath.    . benzonatate (TESSALON) 200 MG capsule Take 1 capsule (200 mg total) by mouth  3 (three) times daily as needed for cough. 90 capsule 1  . ELDERBERRY PO Take 1 capsule by mouth daily.    Marland Kitchen esomeprazole (NEXIUM) 20 MG capsule Take 20 mg by mouth every evening.    . methimazole (TAPAZOLE) 5 MG tablet Take 0.5 tablets (2.5 mg total) by mouth daily. 30 tablet 6  . triamcinolone ointment (KENALOG) 0.1 % Apply 1 application topically daily as needed for irritation.      No Known Allergies  There were no vitals filed for this visit.  Lungs: clear to ascultation Cor:  RRR Abdomen:  soft, nontender, nondistended. Ex:  no cords, erythema Pelvic:   .   Vulva: no masses, no atrophy, no lesions .   Vagina: no tenderness, no erythema, no abnormal vaginal  discharge, no vesicle(s) or ulcers, no cystocele, no rectocele .   Cervix: grossly normal, no discharge, no cervical motion tenderness .   Uterus: normal shape, midline, no uterine prolapse, mobile, non-tender, enlarged (tall and about 13 weeks, mobile) .   Bladder/Urethra: normal meatus, no urethral discharge, no urethral mass, bladder non distended, Urethra well supported .   Adnexa/Parametria: no parametrial tenderness, no parametrial mass, no adnexal tenderness, no ovarian mass  A:  For robotic TLH with salpingectomies, cystoscopy.     P: P: All risks, benefits and alternatives d/w patient and she desires to proceed.  Patient has undergone a modified diet, ERAS protocol and will receive preop antibiotics and SCDs during the operation.   Pt to have extended recovery but will go home same day if eating, ambulating, voiding and pain control is good.  Daria Pastures

## 2019-05-25 ENCOUNTER — Encounter (HOSPITAL_BASED_OUTPATIENT_CLINIC_OR_DEPARTMENT_OTHER)
Admission: RE | Disposition: A | Discharge status: Home / Self Care | Payer: Self-pay | Source: Home / Self Care | Attending: Obstetrics and Gynecology

## 2019-05-25 ENCOUNTER — Other Ambulatory Visit: Payer: Self-pay

## 2019-05-25 ENCOUNTER — Ambulatory Visit (HOSPITAL_BASED_OUTPATIENT_CLINIC_OR_DEPARTMENT_OTHER): Payer: BC Managed Care – PPO | Admitting: Certified Registered Nurse Anesthetist

## 2019-05-25 ENCOUNTER — Ambulatory Visit (HOSPITAL_BASED_OUTPATIENT_CLINIC_OR_DEPARTMENT_OTHER): Payer: BC Managed Care – PPO | Admitting: Physician Assistant

## 2019-05-25 ENCOUNTER — Ambulatory Visit (HOSPITAL_BASED_OUTPATIENT_CLINIC_OR_DEPARTMENT_OTHER)
Admission: RE | Admit: 2019-05-25 | Discharge: 2019-05-25 | Disposition: A | Discharge status: Home / Self Care | Payer: BC Managed Care – PPO | Attending: Obstetrics and Gynecology | Admitting: Obstetrics and Gynecology

## 2019-05-25 ENCOUNTER — Encounter (HOSPITAL_BASED_OUTPATIENT_CLINIC_OR_DEPARTMENT_OTHER): Payer: Self-pay | Admitting: Obstetrics and Gynecology

## 2019-05-25 DIAGNOSIS — N946 Dysmenorrhea, unspecified: Secondary | ICD-10-CM | POA: Diagnosis not present

## 2019-05-25 DIAGNOSIS — D259 Leiomyoma of uterus, unspecified: Secondary | ICD-10-CM | POA: Insufficient documentation

## 2019-05-25 DIAGNOSIS — N888 Other specified noninflammatory disorders of cervix uteri: Secondary | ICD-10-CM | POA: Diagnosis not present

## 2019-05-25 DIAGNOSIS — N838 Other noninflammatory disorders of ovary, fallopian tube and broad ligament: Secondary | ICD-10-CM | POA: Diagnosis not present

## 2019-05-25 DIAGNOSIS — Z9889 Other specified postprocedural states: Secondary | ICD-10-CM | POA: Diagnosis not present

## 2019-05-25 DIAGNOSIS — E0500 Thyrotoxicosis with diffuse goiter without thyrotoxic crisis or storm: Secondary | ICD-10-CM | POA: Insufficient documentation

## 2019-05-25 DIAGNOSIS — Z79899 Other long term (current) drug therapy: Secondary | ICD-10-CM | POA: Diagnosis not present

## 2019-05-25 DIAGNOSIS — G7000 Myasthenia gravis without (acute) exacerbation: Secondary | ICD-10-CM | POA: Diagnosis not present

## 2019-05-25 DIAGNOSIS — D282 Benign neoplasm of uterine tubes and ligaments: Secondary | ICD-10-CM | POA: Diagnosis not present

## 2019-05-25 DIAGNOSIS — N800 Endometriosis of uterus: Secondary | ICD-10-CM | POA: Diagnosis not present

## 2019-05-25 HISTORY — PX: ROBOTIC ASSISTED LAPAROSCOPIC HYSTERECTOMY AND SALPINGECTOMY: SHX6379

## 2019-05-25 HISTORY — PX: CYSTOSCOPY: SHX5120

## 2019-05-25 LAB — TYPE AND SCREEN
ABO/RH(D): A POS
Antibody Screen: NEGATIVE

## 2019-05-25 LAB — POCT PREGNANCY, URINE: Preg Test, Ur: NEGATIVE

## 2019-05-25 SURGERY — XI ROBOTIC ASSISTED LAPAROSCOPIC HYSTERECTOMY AND SALPINGECTOMY
Anesthesia: General | Site: Bladder

## 2019-05-25 MED ORDER — SUGAMMADEX SODIUM 200 MG/2ML IV SOLN
INTRAVENOUS | Status: DC | PRN
  Administered 2019-05-25: 125 mg via INTRAVENOUS

## 2019-05-25 MED ORDER — DEXAMETHASONE SODIUM PHOSPHATE 4 MG/ML IJ SOLN
INTRAMUSCULAR | Status: DC | PRN
  Administered 2019-05-25: 5 mg via INTRAVENOUS

## 2019-05-25 MED ORDER — LACTATED RINGERS IV SOLN
INTRAVENOUS | Status: DC
  Filled 2019-05-25: qty 1000

## 2019-05-25 MED ORDER — OXYCODONE-ACETAMINOPHEN 5-325 MG PO TABS
ORAL_TABLET | ORAL | Status: AC
  Filled 2019-05-25: qty 1

## 2019-05-25 MED ORDER — KETOROLAC TROMETHAMINE 15 MG/ML IJ SOLN
15.0000 mg | INTRAMUSCULAR | Status: DC
  Filled 2019-05-25: qty 1

## 2019-05-25 MED ORDER — ONDANSETRON HCL 4 MG/2ML IJ SOLN
INTRAMUSCULAR | Status: AC
  Filled 2019-05-25: qty 2

## 2019-05-25 MED ORDER — DEXAMETHASONE SODIUM PHOSPHATE 10 MG/ML IJ SOLN
INTRAMUSCULAR | Status: AC
  Filled 2019-05-25: qty 1

## 2019-05-25 MED ORDER — ROCURONIUM BROMIDE 10 MG/ML (PF) SYRINGE
PREFILLED_SYRINGE | INTRAVENOUS | Status: AC
  Filled 2019-05-25: qty 10

## 2019-05-25 MED ORDER — MIDAZOLAM HCL 2 MG/2ML IJ SOLN
INTRAMUSCULAR | Status: AC
  Filled 2019-05-25: qty 2

## 2019-05-25 MED ORDER — PROPOFOL 10 MG/ML IV BOLUS
INTRAVENOUS | Status: AC
  Filled 2019-05-25: qty 20

## 2019-05-25 MED ORDER — SODIUM CHLORIDE 0.9 % IV SOLN
INTRAVENOUS | Status: DC | PRN
  Administered 2019-05-25: 13:00:00

## 2019-05-25 MED ORDER — SODIUM CHLORIDE 0.9 % IR SOLN
Status: DC | PRN
  Administered 2019-05-25: 1000 mL via INTRAVESICAL

## 2019-05-25 MED ORDER — MEPERIDINE HCL 25 MG/ML IJ SOLN
6.2500 mg | INTRAMUSCULAR | Status: DC | PRN
  Filled 2019-05-25: qty 1

## 2019-05-25 MED ORDER — PANTOPRAZOLE SODIUM 40 MG PO TBEC
DELAYED_RELEASE_TABLET | ORAL | Status: AC
  Filled 2019-05-25: qty 1

## 2019-05-25 MED ORDER — DEXMEDETOMIDINE HCL 200 MCG/2ML IV SOLN
INTRAVENOUS | Status: DC | PRN
  Administered 2019-05-25: 16 ug via INTRAVENOUS

## 2019-05-25 MED ORDER — ACETAMINOPHEN 500 MG PO TABS
1000.0000 mg | ORAL_TABLET | ORAL | Status: AC
  Administered 2019-05-25: 1000 mg via ORAL
  Filled 2019-05-25: qty 2

## 2019-05-25 MED ORDER — GABAPENTIN 300 MG PO CAPS
300.0000 mg | ORAL_CAPSULE | ORAL | Status: AC
  Administered 2019-05-25: 300 mg via ORAL
  Filled 2019-05-25: qty 1

## 2019-05-25 MED ORDER — CELECOXIB 400 MG PO CAPS
400.0000 mg | ORAL_CAPSULE | ORAL | Status: AC
  Administered 2019-05-25: 400 mg via ORAL
  Filled 2019-05-25: qty 1

## 2019-05-25 MED ORDER — METOCLOPRAMIDE HCL 5 MG/ML IJ SOLN
10.0000 mg | Freq: Once | INTRAMUSCULAR | Status: DC | PRN
  Filled 2019-05-25: qty 2

## 2019-05-25 MED ORDER — KETOROLAC TROMETHAMINE 30 MG/ML IJ SOLN
INTRAMUSCULAR | Status: AC
  Filled 2019-05-25: qty 1

## 2019-05-25 MED ORDER — KETOROLAC TROMETHAMINE 30 MG/ML IJ SOLN
30.0000 mg | Freq: Four times a day (QID) | INTRAMUSCULAR | Status: DC
  Administered 2019-05-25: 30 mg via INTRAVENOUS
  Filled 2019-05-25: qty 1

## 2019-05-25 MED ORDER — OXYCODONE-ACETAMINOPHEN 5-325 MG PO TABS: 1.0000 | ORAL_TABLET | ORAL | 0 refills | Status: DC | PRN

## 2019-05-25 MED ORDER — ROCURONIUM BROMIDE 100 MG/10ML IV SOLN
INTRAVENOUS | Status: DC | PRN
  Administered 2019-05-25: 100 mg via INTRAVENOUS

## 2019-05-25 MED ORDER — CELECOXIB 200 MG PO CAPS
ORAL_CAPSULE | ORAL | Status: AC
  Filled 2019-05-25: qty 2

## 2019-05-25 MED ORDER — ONDANSETRON HCL 4 MG/2ML IJ SOLN
4.0000 mg | Freq: Four times a day (QID) | INTRAMUSCULAR | Status: DC | PRN
  Filled 2019-05-25: qty 2

## 2019-05-25 MED ORDER — LIDOCAINE HCL (CARDIAC) PF 100 MG/5ML IV SOSY
PREFILLED_SYRINGE | INTRAVENOUS | Status: DC | PRN
  Administered 2019-05-25: 100 mg via INTRAVENOUS

## 2019-05-25 MED ORDER — FENTANYL CITRATE (PF) 100 MCG/2ML IJ SOLN
25.0000 ug | INTRAMUSCULAR | Status: DC | PRN
  Filled 2019-05-25: qty 1

## 2019-05-25 MED ORDER — FENTANYL CITRATE (PF) 100 MCG/2ML IJ SOLN
INTRAMUSCULAR | Status: DC | PRN
  Administered 2019-05-25 (×4): 50 ug via INTRAVENOUS

## 2019-05-25 MED ORDER — FENTANYL CITRATE (PF) 250 MCG/5ML IJ SOLN
INTRAMUSCULAR | Status: AC
  Filled 2019-05-25: qty 5

## 2019-05-25 MED ORDER — ONDANSETRON HCL 4 MG/2ML IJ SOLN
INTRAMUSCULAR | Status: DC | PRN
  Administered 2019-05-25: 4 mg via INTRAVENOUS

## 2019-05-25 MED ORDER — ONDANSETRON HCL 4 MG PO TABS
4.0000 mg | ORAL_TABLET | Freq: Four times a day (QID) | ORAL | Status: DC | PRN
  Filled 2019-05-25: qty 1

## 2019-05-25 MED ORDER — IBUPROFEN 800 MG PO TABS
800.0000 mg | ORAL_TABLET | Freq: Three times a day (TID) | ORAL | Status: DC
  Filled 2019-05-25: qty 1

## 2019-05-25 MED ORDER — SENNA 8.6 MG PO TABS
1.0000 | ORAL_TABLET | Freq: Two times a day (BID) | ORAL | Status: DC
  Administered 2019-05-25: 8.6 mg via ORAL
  Filled 2019-05-25: qty 1

## 2019-05-25 MED ORDER — METHIMAZOLE 5 MG PO TABS
2.5000 mg | ORAL_TABLET | Freq: Every day | ORAL | Status: DC
  Administered 2019-05-25: 2.5 mg via ORAL
  Filled 2019-05-25: qty 1

## 2019-05-25 MED ORDER — BENZONATATE 100 MG PO CAPS
200.0000 mg | ORAL_CAPSULE | Freq: Three times a day (TID) | ORAL | Status: DC | PRN
  Filled 2019-05-25: qty 2

## 2019-05-25 MED ORDER — MENTHOL 3 MG MT LOZG
1.0000 | LOZENGE | OROMUCOSAL | Status: DC | PRN
  Filled 2019-05-25: qty 9

## 2019-05-25 MED ORDER — SENNA 8.6 MG PO TABS
ORAL_TABLET | ORAL | Status: AC
  Filled 2019-05-25: qty 1

## 2019-05-25 MED ORDER — ALUM & MAG HYDROXIDE-SIMETH 200-200-20 MG/5ML PO SUSP
30.0000 mL | ORAL | Status: DC | PRN
  Filled 2019-05-25: qty 30

## 2019-05-25 MED ORDER — CEFAZOLIN SODIUM-DEXTROSE 2-4 GM/100ML-% IV SOLN
INTRAVENOUS | Status: AC
  Filled 2019-05-25: qty 100

## 2019-05-25 MED ORDER — SOD CITRATE-CITRIC ACID 500-334 MG/5ML PO SOLN
30.0000 mL | ORAL | Status: DC
  Filled 2019-05-25: qty 30

## 2019-05-25 MED ORDER — PANTOPRAZOLE SODIUM 40 MG PO TBEC
40.0000 mg | DELAYED_RELEASE_TABLET | Freq: Every day | ORAL | Status: DC
  Administered 2019-05-25: 40 mg via ORAL
  Filled 2019-05-25: qty 1

## 2019-05-25 MED ORDER — LACTATED RINGERS IV SOLN
INTRAVENOUS | Status: DC
  Administered 2019-05-25: 1000 mL via INTRAVENOUS
  Administered 2019-05-25: 12:00:00 via INTRAVENOUS
  Filled 2019-05-25: qty 1000

## 2019-05-25 MED ORDER — OXYCODONE-ACETAMINOPHEN 5-325 MG PO TABS
1.0000 | ORAL_TABLET | ORAL | Status: DC | PRN
  Administered 2019-05-25: 1 via ORAL
  Filled 2019-05-25: qty 2

## 2019-05-25 MED ORDER — MIDAZOLAM HCL 5 MG/5ML IJ SOLN
INTRAMUSCULAR | Status: DC | PRN
  Administered 2019-05-25: 2 mg via INTRAVENOUS

## 2019-05-25 MED ORDER — GABAPENTIN 300 MG PO CAPS
ORAL_CAPSULE | ORAL | Status: AC
  Filled 2019-05-25: qty 1

## 2019-05-25 MED ORDER — ACETAMINOPHEN 500 MG PO TABS
ORAL_TABLET | ORAL | Status: AC
  Filled 2019-05-25: qty 2

## 2019-05-25 MED ORDER — CEFAZOLIN SODIUM-DEXTROSE 2-4 GM/100ML-% IV SOLN
2.0000 g | INTRAVENOUS | Status: AC
  Administered 2019-05-25: 2 g via INTRAVENOUS
  Filled 2019-05-25: qty 100

## 2019-05-25 MED ORDER — PROPOFOL 10 MG/ML IV BOLUS
INTRAVENOUS | Status: DC | PRN
  Administered 2019-05-25: 200 mg via INTRAVENOUS

## 2019-05-25 MED ORDER — LIDOCAINE 2% (20 MG/ML) 5 ML SYRINGE
INTRAMUSCULAR | Status: AC
  Filled 2019-05-25: qty 5

## 2019-05-25 MED ORDER — TRIAMCINOLONE ACETONIDE 0.1 % EX OINT
1.0000 "application " | TOPICAL_OINTMENT | Freq: Two times a day (BID) | CUTANEOUS | Status: DC
  Filled 2019-05-25: qty 15

## 2019-05-25 MED ORDER — ALBUTEROL SULFATE HFA 108 (90 BASE) MCG/ACT IN AERS
2.0000 | INHALATION_SPRAY | Freq: Four times a day (QID) | RESPIRATORY_TRACT | Status: DC | PRN
  Filled 2019-05-25: qty 6.7

## 2019-05-25 MED ORDER — DEXTROSE IN LACTATED RINGERS 5 % IV SOLN
INTRAVENOUS | Status: DC
  Filled 2019-05-25: qty 1000

## 2019-05-25 SURGICAL SUPPLY — 55 items
ADH SKN CLS APL DERMABOND .7 (GAUZE/BANDAGES/DRESSINGS) ×2
BARRIER ADHS 3X4 INTERCEED (GAUZE/BANDAGES/DRESSINGS) IMPLANT
BRR ADH 4X3 ABS CNTRL BYND (GAUZE/BANDAGES/DRESSINGS)
CANISTER SUCT 3000ML PPV (MISCELLANEOUS) ×3 IMPLANT
COVER BACK TABLE 60X90IN (DRAPES) ×3 IMPLANT
COVER TIP SHEARS 8 DVNC (MISCELLANEOUS) ×2 IMPLANT
COVER TIP SHEARS 8MM DA VINCI (MISCELLANEOUS) ×1
DECANTER SPIKE VIAL GLASS SM (MISCELLANEOUS) ×2 IMPLANT
DEFOGGER SCOPE WARMER CLEARIFY (MISCELLANEOUS) ×3 IMPLANT
DERMABOND ADVANCED (GAUZE/BANDAGES/DRESSINGS) ×1
DERMABOND ADVANCED .7 DNX12 (GAUZE/BANDAGES/DRESSINGS) ×2 IMPLANT
DRAPE ARM DVNC X/XI (DISPOSABLE) ×8 IMPLANT
DRAPE COLUMN DVNC XI (DISPOSABLE) ×2 IMPLANT
DRAPE DA VINCI XI ARM (DISPOSABLE) ×4
DRAPE DA VINCI XI COLUMN (DISPOSABLE) ×1
DURAPREP 26ML APPLICATOR (WOUND CARE) ×3 IMPLANT
ELECT REM PT RETURN 9FT ADLT (ELECTROSURGICAL) ×3
ELECTRODE REM PT RTRN 9FT ADLT (ELECTROSURGICAL) ×2 IMPLANT
GLOVE BIO SURGEON STRL SZ7 (GLOVE) ×9 IMPLANT
GLOVE BIOGEL PI IND STRL 7.0 (GLOVE) ×4 IMPLANT
GLOVE BIOGEL PI INDICATOR 7.0 (GLOVE) ×2
IRRIG SUCT STRYKERFLOW 2 WTIP (MISCELLANEOUS) ×3
IRRIGATION SUCT STRKRFLW 2 WTP (MISCELLANEOUS) ×2 IMPLANT
IV SET EXTENSION GRAVITY 40 LF (IV SETS) ×1 IMPLANT
LEGGING LITHOTOMY PAIR STRL (DRAPES) ×3 IMPLANT
MANIPULATOR ADVINCU DEL 2.5 PL (MISCELLANEOUS) IMPLANT
MANIPULATOR ADVINCU DEL 3.0 PL (MISCELLANEOUS) ×1 IMPLANT
MANIPULATOR ADVINCU DEL 3.5 PL (MISCELLANEOUS) IMPLANT
MANIPULATOR ADVINCU DEL 4.0 PL (MISCELLANEOUS) IMPLANT
NEEDLE INSUFFLATION 120MM (ENDOMECHANICALS) ×3 IMPLANT
OBTURATOR OPTICAL STANDARD 8MM (TROCAR) ×1
OBTURATOR OPTICAL STND 8 DVNC (TROCAR) ×2
OBTURATOR OPTICALSTD 8 DVNC (TROCAR) ×2 IMPLANT
PACK ROBOT WH (CUSTOM PROCEDURE TRAY) ×3 IMPLANT
PACK ROBOTIC GOWN (GOWN DISPOSABLE) ×3 IMPLANT
PACK TRENDGUARD 450 HYBRID PRO (MISCELLANEOUS) IMPLANT
PAD PREP 24X48 CUFFED NSTRL (MISCELLANEOUS) ×3 IMPLANT
POUCH LAPAROSCOPIC INSTRUMENT (MISCELLANEOUS) ×3 IMPLANT
PROTECTOR NERVE ULNAR (MISCELLANEOUS) ×6 IMPLANT
SEAL CANN UNIV 5-8 DVNC XI (MISCELLANEOUS) ×6 IMPLANT
SEAL XI 5MM-8MM UNIVERSAL (MISCELLANEOUS) ×3
SET IRRIG Y TYPE TUR BLADDER L (SET/KITS/TRAYS/PACK) ×3 IMPLANT
SET TRI-LUMEN FLTR TB AIRSEAL (TUBING) ×3 IMPLANT
SUT VIC AB 0 CT1 36 (SUTURE) ×3 IMPLANT
SUT VIC AB 2-0 UR6 27 (SUTURE) ×3 IMPLANT
SUT VICRYL RAPIDE 3 0 (SUTURE) ×6 IMPLANT
SUT VLOC 180 0 9IN  GS21 (SUTURE) ×1
SUT VLOC 180 0 9IN GS21 (SUTURE) ×2 IMPLANT
SYR 50ML LL SCALE MARK (SYRINGE) ×1 IMPLANT
TOWEL OR 17X26 10 PK STRL BLUE (TOWEL DISPOSABLE) ×6 IMPLANT
TRAY FOLEY W/BAG SLVR 14FR (SET/KITS/TRAYS/PACK) ×3 IMPLANT
TRENDGUARD 450 HYBRID PRO PACK (MISCELLANEOUS) ×3
TROCAR BLADELESS OPT 5 100 (ENDOMECHANICALS) IMPLANT
TROCAR PORT AIRSEAL 5X120 (TROCAR) ×3 IMPLANT
WATER STERILE IRR 1000ML POUR (IV SOLUTION) ×2 IMPLANT

## 2019-05-25 NOTE — Progress Notes (Signed)
05/25/2019  1:07 PM  PATIENT:  Deborah Murphy  43 y.o. female  PRE-OPERATIVE DIAGNOSIS:  fibroids  POST-OPERATIVE DIAGNOSIS:  fibroids  PROCEDURE:  Procedure(s): XI ROBOTIC ASSISTED LAPAROSCOPIC HYSTERECTOMY AND SALPINGECTOMY (Bilateral) CYSTOSCOPY (N/A)  SURGEON:  Surgeon(s) and Role:    * Bobbye Charleston, MD - Primary    * Taam-Akelman, Lawrence Santiago, MD - Assisting  ANESTHESIA:   general  EBL:  50 mL   LOCAL MEDICATIONS USED:  OTHER Ropivicaine and Arista  SPECIMEN:  Source of Specimen:  Uterus, cervix, bilateral tubes  DISPOSITION OF SPECIMEN:  PATHOLOGY  COUNTS:  YES  TOURNIQUET:  * No tourniquets in log *  DICTATION: .Note written in EPIC  PLAN OF CARE: Admit for overnight observation  PATIENT DISPOSITION:  PACU - hemodynamically stable.   Delay start of Pharmacological VTE agent (>24hrs) due to surgical blood loss or risk of bleeding: not applicable  Complications:  None.  Findings:  12 weeks size uterus.  Ovaries were normal.  The ureters were identified during multiple points of the case and were always out of the field of dissection.  On cystoscopy, the bladder was intact and bilateral spill was seen from each ureteral orriface.    Medications:  Ancef.  Bupivicaine.      Technique:   After adequate anesthesia was achieved the patient was positioned, prepped and draped in usual sterile fashion.  A speculum was placed in the vagina and the cervix dilated with pratt dilators.  The 3 cm Koh ring Advincula was assembled and placed in proper fashion.  The  Speculum was removed and the bladder catheterized with a foley.     Attention was turned to the abdomen where a 1 cm incision was made 1 cm above the umbilicus.  The veress needle was introduced without aspiration of bowel contents or blood and the abdomen insufflated. The 8.5 mm Robotic trocar was placed and the other three trocar sites were marked out, all approximately 10 cm from each other and the camera.  Two  8.5 mm trocars were placed on either side of the camera port and a 5 mm assistant port was placed 3 cm above the line of the other trocars.  All trocars were inserted under direct visualization of the camera.  The patient was placed in trendelenburg and then the Robot docked.  The fenestrated bipolar were placed on arm 1 and the Hot shears on arm 3 and introduced under direct visualization of the camera.   I then broke scrub and sat down at the console.  The above findings were noted and the ureters identified well out of the field of dissection.  The right fallopian tube was isolated and cauterized with the bipolar.  The Utero-ovarian ligament was then divided with the bipolar cautery and shears.  The posterior broad ligament was then divided with the hot shears until the uterosacral ligament.  The Broad and cardinal ligaments were then cauterized against the cervix to the level of the Koh ring, securing the uterine artery.  Each pedicle was then incised with the shears.  The bladder was backfilled to identify the edges ast the tissue was extremely thick.  The Advincula was perforated in the anterior fundus but well superior to the bladder. The anterior leaf was then incised at the reflection of the vessico-uterine junction and the lateral bladder retracted inferiorly after the round ligament had been divided with the bipolar forceps.  The left tube was cauterized with the bipolar and divided with the shears;  then the left utero-ovarian ligament divided with the bipolar forceps and the scissors.  The round ligament was divided as well and the posterior leaf of the broad ligament then divided with the hot shears. The broad and cardinal ligaments were then cauterized on the left in the same way.   At the level of the internal os, the uterine arteries were bilaterally cauterized with the bipolar.  The ureters were identified well out of the field of dissection.     The bladder was then able to be retracted  inferiorly with blunt and sharp dissection and the vesico-uterine fascia was incised in the midline until the bladder was removed one cm below the Koh ring.  The hot shears then circumferentially incised the vagina at the level of the reflection on the [place] ring.  Once the uterus and cervix were amputated, cautery was used to insure hemostasis of the cuff.  Once hemostasis was achieved, the scissors were changed to the mega suture cut needle driver and the cuff was closed with a running stitches of 0-vicryl V loc.  Cautery was used to ensure hemostasis of the left pedicles very superficially. The ureters were peristalsing bilaterally well and very lateral to the areas of operation.     The Robot was then undocked and I scrubbed back in.  The needle was removed and Ropivicaine was introduced into the pelvis. The skin incisions were closed with subcuticular stitches of 3-0 vicryl Rapide and Dermabond.  All instruments were removed from the vagina and cystoscopy performed, revealing an intact bladder and vigourous spill of urine from each ureteral orifice.  The cystoscope was removed and the patient taken to the recovery room in stable condition.   Pankaj Haack A

## 2019-05-25 NOTE — Anesthesia Procedure Notes (Signed)
Procedure Name: Intubation Date/Time: 05/25/2019 10:37 AM Performed by: Bufford Spikes, CRNA Pre-anesthesia Checklist: Patient identified, Emergency Drugs available, Suction available and Patient being monitored Patient Re-evaluated:Patient Re-evaluated prior to induction Oxygen Delivery Method: Circle system utilized Preoxygenation: Pre-oxygenation with 100% oxygen Induction Type: IV induction Ventilation: Mask ventilation without difficulty Laryngoscope Size: Miller and 2 Grade View: Grade I Tube type: Oral Tube size: 7.0 mm Number of attempts: 1 Airway Equipment and Method: Stylet and Oral airway Placement Confirmation: ETT inserted through vocal cords under direct vision,  positive ETCO2 and breath sounds checked- equal and bilateral Secured at: 20 cm Tube secured with: Tape Dental Injury: Teeth and Oropharynx as per pre-operative assessment

## 2019-05-25 NOTE — Anesthesia Preprocedure Evaluation (Signed)
Anesthesia Evaluation  Patient identified by MRN, date of birth, ID band Patient awake    Reviewed: Allergy & Precautions, NPO status , Patient's Chart, lab work & pertinent test results  Airway Mallampati: II  TM Distance: >3 FB Neck ROM: Full    Dental no notable dental hx.    Pulmonary neg pulmonary ROS,    Pulmonary exam normal breath sounds clear to auscultation       Cardiovascular negative cardio ROS Normal cardiovascular exam Rhythm:Regular Rate:Normal     Neuro/Psych negative neurological ROS  negative psych ROS   GI/Hepatic negative GI ROS, Neg liver ROS,   Endo/Other  negative endocrine ROS  Renal/GU negative Renal ROS  negative genitourinary   Musculoskeletal negative musculoskeletal ROS (+)   Abdominal   Peds negative pediatric ROS (+)  Hematology negative hematology ROS (+)   Anesthesia Other Findings   Reproductive/Obstetrics negative OB ROS                             Anesthesia Physical Anesthesia Plan  ASA: II  Anesthesia Plan: General   Post-op Pain Management:    Induction: Intravenous  PONV Risk Score and Plan: 4 or greater and Ondansetron, Dexamethasone, Midazolam and Scopolamine patch - Pre-op  Airway Management Planned: Oral ETT  Additional Equipment:   Intra-op Plan:   Post-operative Plan: Extubation in OR  Informed Consent: I have reviewed the patients History and Physical, chart, labs and discussed the procedure including the risks, benefits and alternatives for the proposed anesthesia with the patient or authorized representative who has indicated his/her understanding and acceptance.     Dental advisory given  Plan Discussed with: CRNA  Anesthesia Plan Comments:         Anesthesia Quick Evaluation

## 2019-05-25 NOTE — Discharge Summary (Signed)
Physician Discharge Summary  Patient ID: Deborah Murphy MRN: [id number] DOB/AGE: July 09, 1975 43 y.o.  Admit date: 05/25/2019 Discharge date: 05/25/2019  Admission Diagnoses:dysmenorrhea and metrarrhagia  Discharge Diagnoses:  Active Problems:   Postoperative state   Discharged Condition: good  Hospital Course: Uncomplicated Robotic TLH, salpingectomies and cystoscopy.  Consults: None  Significant Diagnostic Studies: none  Treatments: surgery: Robotic TLH, salpingectomies and cystoscopy.   Discharge Exam: Blood pressure 121/71, pulse 70, temperature (!) 97.4 F (36.3 C), resp. rate 14, height 5\' 6"  (1.676 m), weight 60.4 kg, last menstrual period 05/01/2019, SpO2 100 %.   Disposition: Discharge disposition: 01-Home or Self Care       Discharge Instructions    Call MD for:  temperature >100.4   Complete by: As directed    Diet - low sodium heart healthy   Complete by: As directed    Discharge instructions   Complete by: As directed    No driving on narcotics, no sexual activity for 2 weeks.   Increase activity slowly   Complete by: As directed    May shower / Bathe   Complete by: As directed    Shower, no bath for 2 weeks.   Remove dressing in 24 hours   Complete by: As directed    Sexual Activity Restrictions   Complete by: As directed    No sexual activity for 2 weeks.     Allergies as of 05/25/2019   No Known Allergies     Medication List    TAKE these medications   albuterol 108 (90 Base) MCG/ACT inhaler Commonly known as: VENTOLIN HFA Inhale 2 puffs into the lungs every 6 (six) hours as needed for wheezing or shortness of breath.   benzonatate 200 MG capsule Commonly known as: TESSALON Take 1 capsule (200 mg total) by mouth 3 (three) times daily as needed for cough.   ELDERBERRY PO Take 1 capsule by mouth daily.   esomeprazole 20 MG capsule Commonly known as: NEXIUM Take 20 mg by mouth every evening.   methimazole 5 MG tablet Commonly  known as: TAPAZOLE Take 0.5 tablets (2.5 mg total) by mouth daily.   oxyCODONE-acetaminophen 5-325 MG tablet Commonly known as: PERCOCET/ROXICET Take 1-2 tablets by mouth every 4 (four) hours as needed for severe pain.   triamcinolone ointment 0.1 % Commonly known as: KENALOG Apply 1 application topically daily as needed for irritation.      Follow-up Information    Bobbye Charleston, MD Follow up in 4 week(s).   Specialty: Obstetrics and Gynecology Contact information: [address] [place] [address] [phone]           Signed: Daria Pastures 05/25/2019, 1:18 PM

## 2019-05-25 NOTE — Transfer of Care (Signed)
Immediate Anesthesia Transfer of Care Note  Patient: Deborah Murphy  Procedure(s) Performed: XI ROBOTIC ASSISTED LAPAROSCOPIC HYSTERECTOMY AND SALPINGECTOMY (Bilateral Abdomen) CYSTOSCOPY (N/A Bladder)  Patient Location: PACU  Anesthesia Type:General  Level of Consciousness: awake, alert  and oriented  Airway & Oxygen Therapy: Patient Spontanous Breathing and Patient connected to nasal cannula oxygen  Post-op Assessment: Report given to RN and Post -op Vital signs reviewed and stable  Post vital signs: Reviewed and stable  Last Vitals:  Vitals Value Taken Time  BP 120/70 05/25/19 1309  Temp 36.3 C 05/25/19 1309  Pulse 73 05/25/19 1310  Resp 14 05/25/19 1310  SpO2 100 % 05/25/19 1310  Vitals shown include unvalidated device data.  Last Pain:  Vitals:   05/25/19 0949  TempSrc: Oral  PainSc: 0-No pain      Patients Stated Pain Goal: 7 ([id number])  Complications: No apparent anesthesia complications

## 2019-05-25 NOTE — Brief Op Note (Signed)
05/25/2019  1:07 PM  PATIENT:  Deborah Murphy  43 y.o. female  PRE-OPERATIVE DIAGNOSIS:  fibroids  POST-OPERATIVE DIAGNOSIS:  fibroids  PROCEDURE:  Procedure(s): XI ROBOTIC ASSISTED LAPAROSCOPIC HYSTERECTOMY AND SALPINGECTOMY (Bilateral) CYSTOSCOPY (N/A)  SURGEON:  Surgeon(s) and Role:    * Bobbye Charleston, MD - Primary    * Taam-Akelman, Lawrence Santiago, MD - Assisting  ANESTHESIA:   general  EBL:  50 mL   LOCAL MEDICATIONS USED:  OTHER Ropivicaine and Arista  SPECIMEN:  Source of Specimen:  Uterus, cervix, bilateral tubes  DISPOSITION OF SPECIMEN:  PATHOLOGY  COUNTS:  YES  TOURNIQUET:  * No tourniquets in log *  DICTATION: .Note written in EPIC  PLAN OF CARE: Admit for overnight observation  PATIENT DISPOSITION:  PACU - hemodynamically stable.   Delay start of Pharmacological VTE agent (>24hrs) due to surgical blood loss or risk of bleeding: not applicable

## 2019-05-25 NOTE — Progress Notes (Signed)
There has been no change in the patients history, status or exam since the history and physical.  There were no vitals filed for this visit.  Results for orders placed or performed during the hospital encounter of 05/25/19 (from the past 72 hour(s))  Pregnancy, urine POC     Status: None   Collection Time: 05/25/19  9:16 AM  Result Value Ref Range   Preg Test, Ur NEGATIVE NEGATIVE    Comment:        THE SENSITIVITY OF THIS METHODOLOGY IS >24 mIU/mL     Deborah Murphy

## 2019-05-28 LAB — SURGICAL PATHOLOGY

## 2019-05-28 NOTE — Anesthesia Postprocedure Evaluation (Signed)
Anesthesia Post Note  Patient: Kelsa Portwood  Procedure(s) Performed: XI ROBOTIC ASSISTED LAPAROSCOPIC HYSTERECTOMY AND SALPINGECTOMY (Bilateral Abdomen) CYSTOSCOPY (N/A Bladder)     Patient location during evaluation: PACU Anesthesia Type: General Level of consciousness: awake and alert Pain management: pain level controlled Vital Signs Assessment: post-procedure vital signs reviewed and stable Respiratory status: spontaneous breathing, nonlabored ventilation, respiratory function stable and patient connected to nasal cannula oxygen Cardiovascular status: blood pressure returned to baseline and stable Postop Assessment: no apparent nausea or vomiting Anesthetic complications: no    Last Vitals:  Vitals:   05/25/19 1530 05/25/19 1937  BP: 128/76 130/82  Pulse: 86 69  Resp: 16 17  Temp: 36.5 C (!) 36.2 C  SpO2: 100% 100%    Last Pain:  Vitals:   05/25/19 1937  TempSrc:   PainSc: 0-No pain                 Montez Hageman

## 2019-06-01 ENCOUNTER — Emergency Department (HOSPITAL_COMMUNITY): Payer: BC Managed Care – PPO

## 2019-06-01 ENCOUNTER — Encounter (HOSPITAL_COMMUNITY): Payer: Self-pay

## 2019-06-01 ENCOUNTER — Other Ambulatory Visit: Payer: Self-pay

## 2019-06-01 ENCOUNTER — Inpatient Hospital Stay (HOSPITAL_COMMUNITY): Payer: BC Managed Care – PPO

## 2019-06-01 ENCOUNTER — Inpatient Hospital Stay (HOSPITAL_COMMUNITY)
Admission: EM | Admit: 2019-06-01 | Discharge: 2019-06-11 | DRG: 862 | Disposition: A | Discharge status: Home Health | Payer: BC Managed Care – PPO | Attending: Obstetrics and Gynecology | Admitting: Obstetrics and Gynecology

## 2019-06-01 DIAGNOSIS — Z833 Family history of diabetes mellitus: Secondary | ICD-10-CM | POA: Diagnosis not present

## 2019-06-01 DIAGNOSIS — Z9079 Acquired absence of other genital organ(s): Secondary | ICD-10-CM

## 2019-06-01 DIAGNOSIS — K567 Ileus, unspecified: Secondary | ICD-10-CM | POA: Diagnosis not present

## 2019-06-01 DIAGNOSIS — N390 Urinary tract infection, site not specified: Secondary | ICD-10-CM | POA: Diagnosis present

## 2019-06-01 DIAGNOSIS — K56609 Unspecified intestinal obstruction, unspecified as to partial versus complete obstruction: Secondary | ICD-10-CM

## 2019-06-01 DIAGNOSIS — R1110 Vomiting, unspecified: Secondary | ICD-10-CM | POA: Diagnosis not present

## 2019-06-01 DIAGNOSIS — Z90710 Acquired absence of both cervix and uterus: Secondary | ICD-10-CM | POA: Diagnosis not present

## 2019-06-01 DIAGNOSIS — R188 Other ascites: Secondary | ICD-10-CM | POA: Diagnosis not present

## 2019-06-01 DIAGNOSIS — R509 Fever, unspecified: Secondary | ICD-10-CM | POA: Diagnosis not present

## 2019-06-01 DIAGNOSIS — K651 Peritoneal abscess: Secondary | ICD-10-CM | POA: Diagnosis present

## 2019-06-01 DIAGNOSIS — N1330 Unspecified hydronephrosis: Secondary | ICD-10-CM | POA: Diagnosis not present

## 2019-06-01 DIAGNOSIS — Z79899 Other long term (current) drug therapy: Secondary | ICD-10-CM

## 2019-06-01 DIAGNOSIS — R112 Nausea with vomiting, unspecified: Secondary | ICD-10-CM | POA: Diagnosis not present

## 2019-06-01 DIAGNOSIS — Z4659 Encounter for fitting and adjustment of other gastrointestinal appliance and device: Secondary | ICD-10-CM

## 2019-06-01 DIAGNOSIS — R000 Tachycardia, unspecified: Secondary | ICD-10-CM | POA: Diagnosis not present

## 2019-06-01 DIAGNOSIS — Z4682 Encounter for fitting and adjustment of non-vascular catheter: Secondary | ICD-10-CM | POA: Diagnosis not present

## 2019-06-01 DIAGNOSIS — K828 Other specified diseases of gallbladder: Secondary | ICD-10-CM | POA: Diagnosis not present

## 2019-06-01 DIAGNOSIS — K219 Gastro-esophageal reflux disease without esophagitis: Secondary | ICD-10-CM | POA: Diagnosis present

## 2019-06-01 DIAGNOSIS — D649 Anemia, unspecified: Secondary | ICD-10-CM | POA: Diagnosis present

## 2019-06-01 DIAGNOSIS — A4181 Sepsis due to Enterococcus: Secondary | ICD-10-CM | POA: Diagnosis present

## 2019-06-01 DIAGNOSIS — E860 Dehydration: Secondary | ICD-10-CM | POA: Diagnosis present

## 2019-06-01 DIAGNOSIS — T8144XA Sepsis following a procedure, initial encounter: Secondary | ICD-10-CM

## 2019-06-01 DIAGNOSIS — T8140XA Infection following a procedure, unspecified, initial encounter: Secondary | ICD-10-CM | POA: Diagnosis present

## 2019-06-01 DIAGNOSIS — Z8249 Family history of ischemic heart disease and other diseases of the circulatory system: Secondary | ICD-10-CM | POA: Diagnosis not present

## 2019-06-01 DIAGNOSIS — Z20828 Contact with and (suspected) exposure to other viral communicable diseases: Secondary | ICD-10-CM | POA: Diagnosis not present

## 2019-06-01 DIAGNOSIS — T8143XA Infection following a procedure, organ and space surgical site, initial encounter: Secondary | ICD-10-CM

## 2019-06-01 DIAGNOSIS — E0500 Thyrotoxicosis with diffuse goiter without thyrotoxic crisis or storm: Secondary | ICD-10-CM | POA: Diagnosis present

## 2019-06-01 DIAGNOSIS — N739 Female pelvic inflammatory disease, unspecified: Secondary | ICD-10-CM

## 2019-06-01 LAB — COMPREHENSIVE METABOLIC PANEL
ALT: 14 U/L (ref 0–44)
AST: 14 U/L — ABNORMAL LOW (ref 15–41)
Albumin: 3.9 g/dL (ref 3.5–5.0)
Alkaline Phosphatase: 81 U/L (ref 38–126)
Anion gap: 12 (ref 5–15)
BUN: 11 mg/dL (ref 6–20)
CO2: 24 mmol/L (ref 22–32)
Calcium: 8.9 mg/dL (ref 8.9–10.3)
Chloride: 101 mmol/L (ref 98–111)
Creatinine, Ser: 0.77 mg/dL (ref 0.44–1.00)
GFR calc Af Amer: >60 mL/min (ref >60)
GFR calc non Af Amer: >60 mL/min (ref >60)
Glucose, Bld: 139 mg/dL — ABNORMAL HIGH (ref 70–99)
Potassium: 3.6 mmol/L (ref 3.5–5.1)
Sodium: 137 mmol/L (ref 135–145)
Total Bilirubin: 2.7 mg/dL — ABNORMAL HIGH (ref 0.3–1.2)
Total Protein: 8.1 g/dL (ref 6.5–8.1)

## 2019-06-01 LAB — SARS CORONAVIRUS 2 (TAT 6-24 HRS): SARS Coronavirus 2: NEGATIVE

## 2019-06-01 LAB — LACTIC ACID, PLASMA
Lactic Acid, Venous: 1.3 mmol/L (ref 0.5–1.9)
Lactic Acid, Venous: 1.2 mmol/L (ref 0.5–1.9)

## 2019-06-01 LAB — URINALYSIS, ROUTINE W REFLEX MICROSCOPIC
Bilirubin Urine: NEGATIVE
Glucose, UA: NEGATIVE mg/dL
Ketones, ur: 5 mg/dL — AB
Leukocytes,Ua: NEGATIVE
Nitrite: NEGATIVE
Protein, ur: 100 mg/dL — AB
Specific Gravity, Urine: >1.046 — ABNORMAL HIGH (ref 1.005–1.030)
pH: 5 (ref 5.0–8.0)

## 2019-06-01 LAB — CBC WITH DIFFERENTIAL/PLATELET
Abs Immature Granulocytes: 0.12 10*3/uL — ABNORMAL HIGH (ref 0.00–0.07)
Basophils Absolute: 0 10*3/uL (ref 0.0–0.1)
Basophils Relative: 0 %
Eosinophils Absolute: 0 10*3/uL (ref 0.0–0.5)
Eosinophils Relative: 0 %
HCT: 35.1 % — ABNORMAL LOW (ref 36.0–46.0)
Hemoglobin: 11.3 g/dL — ABNORMAL LOW (ref 12.0–15.0)
Immature Granulocytes: 1 %
Lymphocytes Relative: 2 %
Lymphs Abs: 0.3 10*3/uL — ABNORMAL LOW (ref 0.7–4.0)
MCH: 31.1 pg (ref 26.0–34.0)
MCHC: 32.2 g/dL (ref 30.0–36.0)
MCV: 96.7 fL (ref 80.0–100.0)
Monocytes Absolute: 0.7 10*3/uL (ref 0.1–1.0)
Monocytes Relative: 4 %
Neutro Abs: 14.2 10*3/uL — ABNORMAL HIGH (ref 1.7–7.7)
Neutrophils Relative %: 93 %
Platelets: 290 10*3/uL (ref 150–400)
RBC: 3.63 MIL/uL — ABNORMAL LOW (ref 3.87–5.11)
RDW: 11.7 % (ref 11.5–15.5)
WBC: 15.3 10*3/uL — ABNORMAL HIGH (ref 4.0–10.5)
nRBC: 0 % (ref 0.0–0.2)

## 2019-06-01 LAB — TSH: TSH: 1.05 u[IU]/mL (ref 0.350–4.500)

## 2019-06-01 LAB — PROTIME-INR
INR: 1.1 (ref 0.8–1.2)
Prothrombin Time: 13.7 seconds (ref 11.4–15.2)

## 2019-06-01 LAB — I-STAT BETA HCG BLOOD, ED (MC, WL, AP ONLY): I-stat hCG, quantitative: <5 m[IU]/mL (ref <5)

## 2019-06-01 LAB — APTT: aPTT: 35 seconds (ref 24–36)

## 2019-06-01 LAB — MAGNESIUM: Magnesium: 2 mg/dL (ref 1.7–2.4)

## 2019-06-01 LAB — POC SARS CORONAVIRUS 2 AG -  ED: SARS Coronavirus 2 Ag: NEGATIVE

## 2019-06-01 MED ORDER — PRENATAL MULTIVITAMIN CH
1.0000 | ORAL_TABLET | Freq: Every day | ORAL | Status: DC
  Administered 2019-06-02 – 2019-06-11 (×9): 1 via ORAL
  Filled 2019-06-01 (×11): qty 1

## 2019-06-01 MED ORDER — HYDROMORPHONE HCL 1 MG/ML IJ SOLN
0.2000 mg | INTRAMUSCULAR | Status: DC | PRN
  Administered 2019-06-01: 0.5 mg via INTRAVENOUS
  Administered 2019-06-01 – 2019-06-02 (×3): 0.6 mg via INTRAVENOUS
  Administered 2019-06-05: 0.5 mg via INTRAVENOUS
  Filled 2019-06-01 (×5): qty 1

## 2019-06-01 MED ORDER — ACETAMINOPHEN 650 MG RE SUPP
650.0000 mg | Freq: Once | RECTAL | Status: AC
  Administered 2019-06-01: 650 mg via RECTAL
  Filled 2019-06-01: qty 1

## 2019-06-01 MED ORDER — ACETAMINOPHEN 325 MG PO TABS: 650.0000 mg | ORAL_TABLET | Freq: Once | ORAL | Status: DC | PRN

## 2019-06-01 MED ORDER — METRONIDAZOLE IN NACL 5-0.79 MG/ML-% IV SOLN
500.0000 mg | Freq: Once | INTRAVENOUS | Status: AC
  Administered 2019-06-01: 500 mg via INTRAVENOUS
  Filled 2019-06-01: qty 100

## 2019-06-01 MED ORDER — MORPHINE SULFATE (PF) 4 MG/ML IV SOLN
4.0000 mg | Freq: Once | INTRAVENOUS | Status: AC
  Administered 2019-06-01: 4 mg via INTRAVENOUS
  Filled 2019-06-01: qty 1

## 2019-06-01 MED ORDER — SODIUM CHLORIDE 0.9% FLUSH: 3.0000 mL | Freq: Once | INTRAVENOUS | Status: DC

## 2019-06-01 MED ORDER — IOHEXOL 300 MG/ML  SOLN
100.0000 mL | Freq: Once | INTRAMUSCULAR | Status: AC | PRN
  Administered 2019-06-01: 100 mL via INTRAVENOUS

## 2019-06-01 MED ORDER — PIPERACILLIN-TAZOBACTAM 3.375 G IVPB
3.3750 g | Freq: Three times a day (TID) | INTRAVENOUS | Status: DC
  Administered 2019-06-01 – 2019-06-06 (×16): 3.375 g via INTRAVENOUS
  Filled 2019-06-01 (×13): qty 50

## 2019-06-01 MED ORDER — SODIUM CHLORIDE 0.9 % IV BOLUS
2000.0000 mL | Freq: Once | INTRAVENOUS | Status: AC
  Administered 2019-06-01: 2000 mL via INTRAVENOUS

## 2019-06-01 MED ORDER — SODIUM CHLORIDE 0.9 % IV SOLN
2.0000 g | Freq: Once | INTRAVENOUS | Status: AC
  Administered 2019-06-01: 2 g via INTRAVENOUS
  Filled 2019-06-01: qty 2

## 2019-06-01 MED ORDER — OXYCODONE-ACETAMINOPHEN 5-325 MG PO TABS: 1.0000 | ORAL_TABLET | ORAL | Status: DC | PRN

## 2019-06-01 MED ORDER — ONDANSETRON HCL 4 MG/2ML IJ SOLN
4.0000 mg | Freq: Once | INTRAMUSCULAR | Status: AC
  Administered 2019-06-01: 4 mg via INTRAVENOUS
  Filled 2019-06-01: qty 2

## 2019-06-01 MED ORDER — ACETAMINOPHEN 325 MG PO TABS: 650.0000 mg | ORAL_TABLET | Freq: Once | ORAL | Status: DC

## 2019-06-01 MED ORDER — LACTATED RINGERS IV SOLN: INTRAVENOUS | Status: DC

## 2019-06-01 MED ORDER — ONDANSETRON HCL 4 MG PO TABS
4.0000 mg | ORAL_TABLET | Freq: Four times a day (QID) | ORAL | Status: DC | PRN
  Administered 2019-06-11: 4 mg via ORAL
  Filled 2019-06-01: qty 1

## 2019-06-01 MED ORDER — IBUPROFEN 800 MG PO TABS
800.0000 mg | ORAL_TABLET | Freq: Three times a day (TID) | ORAL | Status: DC | PRN
  Administered 2019-06-05 – 2019-06-09 (×3): 800 mg via ORAL
  Filled 2019-06-01 (×3): qty 1

## 2019-06-01 MED ORDER — ONDANSETRON HCL 4 MG/2ML IJ SOLN
4.0000 mg | Freq: Four times a day (QID) | INTRAMUSCULAR | Status: DC | PRN
  Administered 2019-06-01 – 2019-06-02 (×4): 4 mg via INTRAVENOUS
  Filled 2019-06-01 (×4): qty 2

## 2019-06-01 MED ORDER — SODIUM CHLORIDE (PF) 0.9 % IJ SOLN
INTRAMUSCULAR | Status: AC
  Filled 2019-06-01: qty 50

## 2019-06-01 NOTE — ED Triage Notes (Signed)
Pt reports a hysterectomy last Friday. States that she started noticing a fever yesterday. She states that she has been feeling nauseous and having diarrhea as well. Fever of 103 in triage. States that she has been taking the vicodin for fever without relief. She did not take any additional tylenol.

## 2019-06-01 NOTE — ED Provider Notes (Signed)
[facility] DEPT Provider Note   CSN: [id number] Arrival date & time: 06/01/19  0100     History Chief Complaint  Patient presents with  . Post-op Problem  . Fever    Deborah Murphy is a 43 y.o. female who is 7 days status post hysterectomy, salpingectomy and cystoscopy for heavy menstrual bleeding, who presents today for evaluation of fever and generally feeling unwell.  She reports that she initially noted a fever yesterday.  She reports nausea, vomiting, and diarrhea.  She reports she has been unable to keep down any pain medications.  She has not had any ibuprofen or Tylenol today prior to arrival.  She reports that she is urinating approximately twice an hour and it is dark in color.  She denies any dysuria, frequency, urgency.  She reports that she has not had any blood in her diarrhea.    She has not had any abnormal/changed vaginal bleeding or discharge.  She reports pain in her lower back bilaterally.  No calf pain.    HPI     Past Medical History:  Diagnosis Date  . Graves disease   . S/P endometrial ablation     Patient Active Problem List   Diagnosis Date Noted  . Postoperative state 05/25/2019  . 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 Graves disease 06/11/2018    Past Surgical History:  Procedure Laterality Date  . CESAREAN SECTION    . CYSTOSCOPY N/A 05/25/2019   Procedure: CYSTOSCOPY;  Surgeon: Bobbye Charleston, MD;  Location: [facility];  Service: Gynecology;  Laterality: N/A;  . ROBOTIC ASSISTED LAPAROSCOPIC HYSTERECTOMY AND SALPINGECTOMY Bilateral 05/25/2019   Procedure: XI ROBOTIC ASSISTED LAPAROSCOPIC HYSTERECTOMY AND SALPINGECTOMY;  Surgeon: Bobbye Charleston, MD;  Location: [facility];  Service: Gynecology;  Laterality: Bilateral;  . TUBAL LIGATION       OB History   No obstetric history on file.     Family History  Problem Relation Age of Onset  . Hypertension Mother   . Diabetes  Mother     Social History   Tobacco Use  . Smoking status: Never Smoker  . Smokeless tobacco: Never Used  Substance Use Topics  . Alcohol use: No  . Drug use: No    Home Medications Prior to Admission medications   Medication Sig Start Date End Date Taking? Authorizing Provider  albuterol (VENTOLIN HFA) 108 (90 Base) MCG/ACT inhaler Inhale 2 puffs into the lungs every 6 (six) hours as needed for wheezing or shortness of breath.    [provider]  benzonatate (TESSALON) 200 MG capsule Take 1 capsule (200 mg total) by mouth 3 (three) times daily as needed for cough. 01/23/19   Collene Gobble, MD  ELDERBERRY PO Take 1 capsule by mouth daily.    [provider]  esomeprazole (NEXIUM) 20 MG capsule Take 20 mg by mouth every evening.    [provider]  methimazole (TAPAZOLE) 5 MG tablet Take 0.5 tablets (2.5 mg total) by mouth daily. 09/18/18   Shamleffer, Melanie Crazier, MD  oxyCODONE-acetaminophen (PERCOCET/ROXICET) 5-325 MG tablet Take 1-2 tablets by mouth every 4 (four) hours as needed for severe pain. 05/25/19   Bobbye Charleston, MD  triamcinolone ointment (KENALOG) 0.1 % Apply 1 application topically daily as needed for irritation. 02/06/19   [provider]    Allergies    Patient has no known allergies.  Review of Systems   Review of Systems  Constitutional: Positive for chills, fatigue and fever.  HENT: Negative for congestion.   Eyes: Negative for visual disturbance.  Respiratory: Negative for cough, chest tightness and shortness of breath.   Cardiovascular: Negative for chest pain, palpitations and leg swelling.  Gastrointestinal: Positive for abdominal pain, diarrhea, nausea and vomiting. Negative for constipation.  Genitourinary: Positive for frequency and pelvic pain (Cramping). Negative for difficulty urinating, dysuria and urgency.  Musculoskeletal: Positive for back pain. Negative for neck pain.  Skin: Positive for wound (Surgical  sites). Negative for color change.  Neurological: Negative for weakness and headaches.  All other systems reviewed and are negative.   Physical Exam Updated Vital Signs BP 129/79   Pulse (!) 117   Temp (!) 103 F (39.4 C) (Oral)   Resp (!) 23   LMP  (LMP Unknown)   SpO2 98%   Physical Exam Vitals and nursing note reviewed.  Constitutional:      Appearance: She is well-developed.     Comments: Appears to feel unwell, is not significantly diaphoretic or in extremis.  HENT:     Head: Normocephalic and atraumatic.  Eyes:     Conjunctiva/sclera: Conjunctivae normal.  Cardiovascular:     Rate and Rhythm: Regular rhythm. Tachycardia present.     Pulses: Normal pulses.     Heart sounds: No murmur.  Pulmonary:     Effort: Pulmonary effort is normal. No respiratory distress.     Breath sounds: Normal breath sounds.  Abdominal:     Palpations: Abdomen is soft.     Tenderness: There is abdominal tenderness.     Comments: Subcutaneous crepitus palpable on the left side of abdomen.  Surgical incisional sites are CDI.  Abdomen is generally tender, primarily in the lower abdomen.   Musculoskeletal:     Cervical back: Normal range of motion and neck supple. No rigidity.     Right lower leg: No edema.     Left lower leg: No edema.  Skin:    General: Skin is warm and dry.  Neurological:     General: No focal deficit present.     Mental Status: She is alert.     Cranial Nerves: No cranial nerve deficit.  Psychiatric:        Mood and Affect: Mood normal.        Behavior: Behavior normal.     ED Results / Procedures / Treatments   Labs (all labs ordered are listed, but only abnormal results are displayed) Labs Reviewed  COMPREHENSIVE METABOLIC PANEL - Abnormal; Notable for the following components:      Result Value   Glucose, Bld 139 (*)    AST 14 (*)    Total Bilirubin 2.7 (*)    All other components within normal limits  CBC WITH DIFFERENTIAL/PLATELET - Abnormal; Notable for  the following components:   WBC 15.3 (*)    RBC 3.63 (*)    Hemoglobin 11.3 (*)    HCT 35.1 (*)    Neutro Abs 14.2 (*)    Lymphs Abs 0.3 (*)    Abs Immature Granulocytes 0.12 (*)    All other components within normal limits  URINALYSIS, ROUTINE W REFLEX MICROSCOPIC - Abnormal; Notable for the following components:   Color, Urine AMBER (*)    APPearance HAZY (*)    Specific Gravity, Urine >1.046 (*)    Hgb urine dipstick SMALL (*)    Ketones, ur 5 (*)    Protein, ur 100 (*)    Bacteria, UA RARE (*)    All other components within  normal limits  CULTURE, BLOOD (ROUTINE X 2)  CULTURE, BLOOD (ROUTINE X 2)  URINE CULTURE  CULTURE, BLOOD (ROUTINE X 2)  CULTURE, BLOOD (ROUTINE X 2)  SARS CORONAVIRUS 2 (TAT 6-24 HRS)  LACTIC ACID, PLASMA  LACTIC ACID, PLASMA  APTT  PROTIME-INR  MAGNESIUM  TSH  I-STAT BETA HCG BLOOD, ED ([facility], [facility], [facility] ONLY)  POC SARS CORONAVIRUS 2 AG -  ED    EKG EKG Interpretation  Date/Time:  Friday June 01 2019 03:40:30 EST Ventricular Rate:  116 PR Interval:    QRS Duration: 78 QT Interval:  278 QTC Calculation: 387 R Axis:   81 Text Interpretation: Sinus tachycardia Borderline repolarization abnormality Confirmed by Addison Lank [phone]) on 06/01/2019 5:48:34 AM   Radiology CT Abdomen Pelvis W Contrast  Result Date: 06/01/2019 CLINICAL DATA:  Acute abdominal pain. Status post hysterectomy 7 days ago. Fever. Nausea and vomiting. EXAM: CT ABDOMEN AND PELVIS WITH CONTRAST TECHNIQUE: Multidetector CT imaging of the abdomen and pelvis was performed using the standard protocol following bolus administration of intravenous contrast. CONTRAST:  155mL OMNIPAQUE IOHEXOL 300 MG/ML  SOLN COMPARISON:  None. FINDINGS: Lower chest: The lung bases are clear without focal nodule, mass, or airspace disease. Heart size is normal. Hepatobiliary: No focal liver abnormality is seen. No gallstones, gallbladder wall thickening, or biliary dilatation. Pancreas: Unremarkable.  No pancreatic ductal dilatation or surrounding inflammatory changes. Spleen: Normal in size without focal abnormality. Adrenals/Urinary Tract: Adrenal glands are normal bilaterally. Kidneys and ureters are within normal limits. Diffuse wall thickening is present about the urinary bladder, likely secondary inflammation. No discrete lesion is present. Stomach/Bowel: The stomach and duodenum are within normal limits. The small bowel is diffusely dilated. Loops measure up to 3.2 cm. Dilated bowel extends into the anatomic pelvis. The colon is relatively collapsed. Vascular/Lymphatic: Subcentimeter retroperitoneal lymph nodes are likely reactive. Focal vascular lesions are present. Reproductive: Patient is status post hysterectomy. Other: Extensive free air is present throughout the abdomen. Multiple developing fluid collections are present throughout the abdomen and pelvis. The largest is within the anatomic pelvis, in the cul-de-sac, likely accessible for percutaneous drainage. This collection measures 4.6 x 2.6 x 2.6 cm. The collection tracks anteriorly along the right side of the pelvis. A second collection is present near the cecum. An anterior collection is noted just above the urinary bladder which may be accessible for percutaneous drainage. This collection measures 3.3 x 1.7 x 2.4 cm. Other smaller collections are more difficult to access. A collection of gas and fluid present near the gallbladder on image 27 and 28/2. This is likely a separate collection from bowel. Does not appear to be accessible. Extensive subcutaneous emphysema is present in addition to the pneumoperitoneum. There is subcutaneous gas over the lower anatomic pelvis and extending into the upper thighs bilaterally. Gas extends of the left side of the abdomen to the chest wall and below the left breast. Musculoskeletal: No acute or significant osseous findings. IMPRESSION: 1. Complex collections of fluid and gas throughout the pelvis and  abdomen consistent with developing abscesses. At least 2 of these areas would be amenable for percutaneous drainage. 2. Small bowel obstruction with transition in the pelvis likely secondary to the diffuse inflammatory changes. 3. In addition to pneumoperitoneum, there is extensive subcutaneous gas over the low anatomic pelvis, into the thighs, and over the left side of the abdomen and lower chest wall. These results were called by telephone at the time of interpretation on 06/01/2019 at 4:35 am to provider  Addison Lank, MD, who verbally acknowledged these results. Electronically Signed   By: San Morelle M.D.   On: 06/01/2019 04:48   DG Chest Port 1 View  Result Date: 06/01/2019 CLINICAL DATA:  Fever and tachycardia. Hysterectomy 05/25/2019. Increasing pain. EXAM: PORTABLE CHEST 1 VIEW COMPARISON:  Chest radiograph 11/09/2018 FINDINGS: The cardiomediastinal contours are normal. The lungs are clear. Pulmonary vasculature is normal. No consolidation, pleural effusion, or pneumothorax. No acute osseous abnormalities are seen. Free air under the right hemidiaphragm. IMPRESSION: 1. No acute chest findings. 2. Free air under the right hemidiaphragm. While this may be related to recent abdominal surgery, consider further evaluation with abdominopelvic CT for further evaluation based on clinical concern. Electronically Signed   By: Keith Rake M.D.   On: 06/01/2019 02:25    Procedures .Critical Care Performed by: Lorin Glass, PA-C Authorized by: Lorin Glass, PA-C   Critical care provider statement:    Critical care time (minutes):  45   Critical care was necessary to treat or prevent imminent or life-threatening deterioration of the following conditions:  Sepsis   Critical care was time spent personally by me on the following activities:  Discussions with consultants, evaluation of patient's response to treatment, examination of patient, ordering and performing treatments and  interventions, ordering and review of laboratory studies, ordering and review of radiographic studies, pulse oximetry, re-evaluation of patient's condition, obtaining history from patient or surrogate and review of old charts   (including critical care time)  Medications Ordered in ED Medications  sodium chloride (PF) 0.9 % injection (has no administration in time range)  ceFEPIme (MAXIPIME) 2 g in sodium chloride 0.9 % 100 mL IVPB (0 g Intravenous Stopped 06/01/19 0451)  metroNIDAZOLE (FLAGYL) IVPB 500 mg (0 mg Intravenous Stopped 06/01/19 0451)  ondansetron (ZOFRAN) injection 4 mg (4 mg Intravenous Given 06/01/19 0330)  morphine 4 MG/ML injection 4 mg (4 mg Intravenous Given 06/01/19 0330)  sodium chloride 0.9 % bolus 2,000 mL (2,000 mLs Intravenous New Bag/Given 06/01/19 0333)  iohexol (OMNIPAQUE) 300 MG/ML solution 100 mL (100 mLs Intravenous Contrast Given 06/01/19 0413)  acetaminophen (TYLENOL) suppository 650 mg (650 mg Rectal Given 06/01/19 [id number])    ED Course  I have reviewed the triage vital signs and the nursing notes.  Pertinent labs & imaging results that were available during my care of the patient were reviewed by me and considered in my medical decision making (see chart for details).  Clinical Course as of May 31 612  Fri Jun 01, 2019  [id number] Total Bilirubin(!): 2.7 [EH]  0510 Spoke with Dr. Harrington Challenger of [facility] who will admit the patient.  Anticipate transfer to [facility].    [EH]    Clinical Course User Index [EH] Ollen Gross   MDM Rules/Calculators/A&P                     Patient presents today for evaluation of fever, abdominal pain, and feeling unwell.  She is 1 week status post laparoscopic hysterectomy.  On arrival she is febrile at 103 and tachycardic into the 120s.  Code sepsis was called.  Rapid Covid antigen test is negative.  She has general tenderness in her abdomen, mostly in the lower abdomen with subcutaneous crepitus noted along  the left side of her abdomen.    Labs were obtained and reviewed, CBC elevated white count at 15.3 with neutrophils of 14.2.  CMP significant for elevated total bilirubin at 2.7 which appears  to be new however is otherwise without significant hematologic or electrolyte derangements.  Lactic acid is not significantly elevated.  Her pain was treated with morphine, and her nausea was treated with Zofran.  Chest x-ray shows right-sided subdiaphragmatic free air, which is consistent with her recent postoperative state.  Blood cultures were obtained prior to the initiation of antibiotics and she was started on broad-spectrum antibiotics for suspected intra-abdominal infection.  PT/INR, magnesium, TSH were normal.  Urine shows elevated specific gravity consistent with dehydration however there is no evidence of infection.  Patient was given 30/kg fluid bolus.  She was not hypotensive while in my care.  CT scan was obtained showing concern for multiple intra-abdominal abscesses along with extensive subcutaneous air on the left side abdomen and small bowel obstruction.  NG tube was placed for SBO.  Given NPO status due to SBO she was given tylenol PR.   I spoke with Dr. Harrington Challenger, on call for [facility] who will admit patient.  Plan is to transfer patient to [facility] for admission.    This patient was seen as a shared visit with Dr. Leonette Monarch.   Final Clinical Impression(s) / ED Diagnoses Final diagnoses:  Sepsis following procedure, initial encounter ([place])  Postprocedural intraabdominal abscess  SBO (small bowel obstruction) ([place])  Encounter for nasogastric (NG) tube placement    Rx / DC Orders ED Discharge Orders    None       Ollen Gross 06/01/19 [id number]    Fatima Blank, MD 06/01/19 [phone]

## 2019-06-01 NOTE — ED Notes (Signed)
[facility] notified need for transport to [id number].

## 2019-06-01 NOTE — Plan of Care (Signed)
  Problem: Education: Goal: Knowledge of General Education information will improve Description Including pain rating scale, medication(s)/side effects and non-pharmacologic comfort measures Outcome: Progressing   

## 2019-06-01 NOTE — ED Provider Notes (Signed)
Attestation: Medical screening examination/treatment/procedure(s) were conducted as a shared visit with non-physician practitioner(s) and myself.  I personally evaluated the patient during the encounter.  Briefly, the patient is a 43 y.o. female with h/o recent laparoscopic hysterectomy, here for fever and abdominal pain.   Vitals:   06/01/19 0500 06/01/19 0515  BP: (!) 149/82 (!) 165/89  Pulse: (!) 129 (!) 141  Resp: (!) 21 (!) 35  Temp:    SpO2: 100% 100%    CONSTITUTIONAL: Ill-appearing, NAD NEURO:  Alert and oriented x 3, no focal deficits EYES:  pupils equal and reactive ENT/NECK:  trachea midline, no JVD CARDIO: Tacky rate, regular rhythm, well-perfused PULM: None labored breathing GI/GU: Lower abdominal tenderness.  Trocar incisions are well-appearing without evidence of superimposed infection.  Subcu crepitus in the left lower quadrant. MSK/SPINE:  No gross deformities, no edema SKIN:  no rash,  PSYCH:  Appropriate speech and behavior    EKG Interpretation  Date/Time:    Ventricular Rate:    PR Interval:    QRS Duration:   QT Interval:    QTC Calculation:   R Axis:     Text Interpretation:         Work up consistent with sepsis related to postoperative intra-abdominal abscesses.  Empiric antibiotics initiated.  Patient provided with IV fluids and pain medicine.  OB/GYN consulted for admission and further management.  .Critical Care Performed by: Fatima Blank, MD Authorized by: Fatima Blank, MD    CRITICAL CARE Performed by: Grayce Sessions Caidan Hubbert Total critical care time: 10 minutes Critical care time was exclusive of separately billable procedures and treating other patients. Critical care was necessary to treat or prevent imminent or life-threatening deterioration. Critical care was time spent personally by me on the following activities: development of treatment plan with patient and/or surrogate as well as nursing, discussions with  consultants, evaluation of patient's response to treatment, examination of patient, obtaining history from patient or surrogate, ordering and performing treatments and interventions, ordering and review of laboratory studies, ordering and review of radiographic studies, pulse oximetry and re-evaluation of patient's condition.            Fatima Blank, MD 06/01/19 [phone]

## 2019-06-01 NOTE — ED Notes (Signed)
Pt assisted to bedside commode. Pt had small BM. °

## 2019-06-01 NOTE — H&P (Signed)
Deborah Murphy is an 43 y.o. female.   43 yo S/P robotic hysterectomy on 12/11 presents to ED c/o fever and worsening nausea. Patient developed a fever on the afternoon of 12/17. Initially this was improved by tylenol but throughout the evening symptoms became worse and she presented to [facility] ED for evaluation.   In the ED, Tmax 103.CT scan shows intraabdominal infection. Multiple fluid collections seen,  the largest is within the anatomic pelvis, in the cul-de-sac, likely accessible for percutaneous drainage. This collection measures 4.6 x 2.6 x 2.6 cm. The collection tracks anteriorly along the right side of the pelvis. A second collection is present near the cecum. An anterior collection is noted just above the urinary bladder which may be accessible for percutaneous drainage. This collection measures 3.3 x 1.7 x 2.4 cm. Other smaller collections are more difficult to access. A collection of gas and fluid present near the gallbladder on image 27 and 28/2. This is likely a separate collection from bowel. Does not appear to be accessible.  Extensive subcutaneous emphysema is present in addition to the pneumoperitoneum. There is subcutaneous gas over the lower anatomic pelvis and extending into the upper thighs bilaterally. Gas extends of the left side of the abdomen to the chest wall and below the left Breast.  Due to tachycardia, fever, and WBC pt met ED criteria for sepsis and was given a dose of cefipime    No LMP recorded (lmp unknown). Patient has had a hysterectomy.    Past Medical History:  Diagnosis Date  . Graves disease   . S/P endometrial ablation     Past Surgical History:  Procedure Laterality Date  . CESAREAN SECTION    . CYSTOSCOPY N/A 05/25/2019   Procedure: CYSTOSCOPY;  Surgeon: Bobbye Charleston, MD;  Location: [facility];  Service: Gynecology;  Laterality: N/A;  . ROBOTIC ASSISTED LAPAROSCOPIC HYSTERECTOMY AND SALPINGECTOMY Bilateral 05/25/2019    Procedure: XI ROBOTIC ASSISTED LAPAROSCOPIC HYSTERECTOMY AND SALPINGECTOMY;  Surgeon: Bobbye Charleston, MD;  Location: [facility];  Service: Gynecology;  Laterality: Bilateral;  . TUBAL LIGATION      Family History  Problem Relation Age of Onset  . Hypertension Mother   . Diabetes Mother     Social History:  reports that she has never smoked. She has never used smokeless tobacco. She reports that she does not drink alcohol or use drugs.  Allergies: No Known Allergies  (Not in a hospital admission)   Review of Systems  Blood pressure (!) 165/89, pulse (!) 141, temperature (!) 103 F (39.4 C), temperature source Oral, resp. rate (!) 35, height '5\' 6"'  (1.676 m), weight 59 kg, SpO2 100 %. Physical Exam   Per ED PA  Results for orders placed or performed during the hospital encounter of 06/01/19 (from the past 24 hour(s))  Lactic acid, plasma     Status: None   Collection Time: 06/01/19  2:27 AM  Result Value Ref Range   Lactic Acid, Venous 1.3 0.5 - 1.9 mmol/L  Comprehensive metabolic panel     Status: Abnormal   Collection Time: 06/01/19  2:28 AM  Result Value Ref Range   Sodium 137 135 - 145 mmol/L   Potassium 3.6 3.5 - 5.1 mmol/L   Chloride 101 98 - 111 mmol/L   CO2 24 22 - 32 mmol/L   Glucose, Bld 139 (H) 70 - 99 mg/dL   BUN 11 6 - 20 mg/dL   Creatinine, Ser 0.77 0.44 - 1.00 mg/dL  Calcium 8.9 8.9 - 10.3 mg/dL   Total Protein 8.1 6.5 - 8.1 g/dL   Albumin 3.9 3.5 - 5.0 g/dL   AST 14 (L) 15 - 41 U/L   ALT 14 0 - 44 U/L   Alkaline Phosphatase 81 38 - 126 U/L   Total Bilirubin 2.7 (H) 0.3 - 1.2 mg/dL   GFR calc non Af Amer >60 >60 mL/min   GFR calc Af Amer >60 >60 mL/min   Anion gap 12 5 - 15  CBC with Differential     Status: Abnormal   Collection Time: 06/01/19  2:28 AM  Result Value Ref Range   WBC 15.3 (H) 4.0 - 10.5 K/uL   RBC 3.63 (L) 3.87 - 5.11 MIL/uL   Hemoglobin 11.3 (L) 12.0 - 15.0 g/dL   HCT 35.1 (L) 36.0 - 46.0 %   MCV 96.7 80.0 -  100.0 fL   MCH 31.1 26.0 - 34.0 pg   MCHC 32.2 30.0 - 36.0 g/dL   RDW 11.7 11.5 - 15.5 %   Platelets 290 150 - 400 K/uL   nRBC 0.0 0.0 - 0.2 %   Neutrophils Relative % 93 %   Neutro Abs 14.2 (H) 1.7 - 7.7 K/uL   Lymphocytes Relative 2 %   Lymphs Abs 0.3 (L) 0.7 - 4.0 K/uL   Monocytes Relative 4 %   Monocytes Absolute 0.7 0.1 - 1.0 K/uL   Eosinophils Relative 0 %   Eosinophils Absolute 0.0 0.0 - 0.5 K/uL   Basophils Relative 0 %   Basophils Absolute 0.0 0.0 - 0.1 K/uL   Immature Granulocytes 1 %   Abs Immature Granulocytes 0.12 (H) 0.00 - 0.07 K/uL  I-Stat beta hCG blood, ED     Status: None   Collection Time: 06/01/19  2:33 AM  Result Value Ref Range   I-stat hCG, quantitative <5.0 <5 mIU/mL   Comment 3          APTT     Status: None   Collection Time: 06/01/19  2:43 AM  Result Value Ref Range   aPTT 35 24 - 36 seconds  Protime-INR     Status: None   Collection Time: 06/01/19  2:43 AM  Result Value Ref Range   Prothrombin Time 13.7 11.4 - 15.2 seconds   INR 1.1 0.8 - 1.2  Magnesium     Status: None   Collection Time: 06/01/19  2:43 AM  Result Value Ref Range   Magnesium 2.0 1.7 - 2.4 mg/dL  TSH     Status: None   Collection Time: 06/01/19  2:47 AM  Result Value Ref Range   TSH 1.050 0.350 - 4.500 uIU/mL  Lactic acid, plasma     Status: None   Collection Time: 06/01/19  3:26 AM  Result Value Ref Range   Lactic Acid, Venous 1.2 0.5 - 1.9 mmol/L  POC SARS Coronavirus 2 Ag-ED - Nasal Swab (BD Veritor Kit)     Status: None   Collection Time: 06/01/19  3:39 AM  Result Value Ref Range   SARS Coronavirus 2 Ag NEGATIVE NEGATIVE    CT Abdomen Pelvis W Contrast  Result Date: 06/01/2019 CLINICAL DATA:  Acute abdominal pain. Status post hysterectomy 7 days ago. Fever. Nausea and vomiting. EXAM: CT ABDOMEN AND PELVIS WITH CONTRAST TECHNIQUE: Multidetector CT imaging of the abdomen and pelvis was performed using the standard protocol following bolus administration of intravenous  contrast. CONTRAST:  171m OMNIPAQUE IOHEXOL 300 MG/ML  SOLN COMPARISON:  None. FINDINGS:  Lower chest: The lung bases are clear without focal nodule, mass, or airspace disease. Heart size is normal. Hepatobiliary: No focal liver abnormality is seen. No gallstones, gallbladder wall thickening, or biliary dilatation. Pancreas: Unremarkable. No pancreatic ductal dilatation or surrounding inflammatory changes. Spleen: Normal in size without focal abnormality. Adrenals/Urinary Tract: Adrenal glands are normal bilaterally. Kidneys and ureters are within normal limits. Diffuse wall thickening is present about the urinary bladder, likely secondary inflammation. No discrete lesion is present. Stomach/Bowel: The stomach and duodenum are within normal limits. The small bowel is diffusely dilated. Loops measure up to 3.2 cm. Dilated bowel extends into the anatomic pelvis. The colon is relatively collapsed. Vascular/Lymphatic: Subcentimeter retroperitoneal lymph nodes are likely reactive. Focal vascular lesions are present. Reproductive: Patient is status post hysterectomy. Other: Extensive free air is present throughout the abdomen. Multiple developing fluid collections are present throughout the abdomen and pelvis. The largest is within the anatomic pelvis, in the cul-de-sac, likely accessible for percutaneous drainage. This collection measures 4.6 x 2.6 x 2.6 cm. The collection tracks anteriorly along the right side of the pelvis. A second collection is present near the cecum. An anterior collection is noted just above the urinary bladder which may be accessible for percutaneous drainage. This collection measures 3.3 x 1.7 x 2.4 cm. Other smaller collections are more difficult to access. A collection of gas and fluid present near the gallbladder on image 27 and 28/2. This is likely a separate collection from bowel. Does not appear to be accessible. Extensive subcutaneous emphysema is present in addition to the  pneumoperitoneum. There is subcutaneous gas over the lower anatomic pelvis and extending into the upper thighs bilaterally. Gas extends of the left side of the abdomen to the chest wall and below the left breast. Musculoskeletal: No acute or significant osseous findings. IMPRESSION: 1. Complex collections of fluid and gas throughout the pelvis and abdomen consistent with developing abscesses. At least 2 of these areas would be amenable for percutaneous drainage. 2. Small bowel obstruction with transition in the pelvis likely secondary to the diffuse inflammatory changes. 3. In addition to pneumoperitoneum, there is extensive subcutaneous gas over the low anatomic pelvis, into the thighs, and over the left side of the abdomen and lower chest wall. These results were called by telephone at the time of interpretation on 06/01/2019 at 4:35 am to provider Addison Lank, MD, who verbally acknowledged these results. Electronically Signed   By: San Morelle M.D.   On: 06/01/2019 04:48   DG Chest Port 1 View  Result Date: 06/01/2019 CLINICAL DATA:  Fever and tachycardia. Hysterectomy 05/25/2019. Increasing pain. EXAM: PORTABLE CHEST 1 VIEW COMPARISON:  Chest radiograph 11/09/2018 FINDINGS: The cardiomediastinal contours are normal. The lungs are clear. Pulmonary vasculature is normal. No consolidation, pleural effusion, or pneumothorax. No acute osseous abnormalities are seen. Free air under the right hemidiaphragm. IMPRESSION: 1. No acute chest findings. 2. Free air under the right hemidiaphragm. While this may be related to recent abdominal surgery, consider further evaluation with abdominopelvic CT for further evaluation based on clinical concern. Electronically Signed   By: Keith Rake M.D.   On: 06/01/2019 02:25    Assessment/Plan: 1) Will transfer via care link to [facility] for admission to faciliitate care coordination 2) Zosyn 3.375 mg Q 6 hrs 3) NPO at this time in case IR percutaneous drainage of  larges abscess is entertained 4) SCDs  Vanessa Kick 06/01/2019, 5:22 AM

## 2019-06-01 NOTE — ED Notes (Signed)
Pt updated on plan of care

## 2019-06-01 NOTE — ED Notes (Signed)
Please Contact Dr. Bobbye Charleston for all concerns [phone]

## 2019-06-01 NOTE — Progress Notes (Signed)
A consult was received from an ED physician for Cefepime per pharmacy dosing.  The patient's profile has been reviewed for ht/wt/allergies/indication/available labs.   A one time order has been placed for Cefepime 2gm iv x1.  Further antibiotics/pharmacy consults should be ordered by admitting physician if indicated.                       Thank you, Nani Skillern Crowford 06/01/2019  2:47 AM

## 2019-06-01 NOTE — ED Notes (Signed)
Report given to [facility]

## 2019-06-01 NOTE — ED Notes (Signed)
Pt provided with a pillow.

## 2019-06-01 NOTE — ED Notes (Addendum)
Pt called out stating there her securement device holding NG tube in place was coming off. Securement device intact but peeling up on side. Securement device removed and a new device placed, benzoin used for additional adhesive. The NG tube is still functioning; hooked up to low wall suction. Pt updated on plan of care. Pt tearful and anxious. Pt refusing anything for anxiety at this time. Pt informed to call this RN if she needs something for anxiety or pain. Pt verbalizes understanding.

## 2019-06-01 NOTE — ED Notes (Signed)
Pt transported via [facility]  

## 2019-06-02 LAB — BLOOD CULTURE ID PANEL (REFLEXED)

## 2019-06-02 LAB — CBC WITH DIFFERENTIAL/PLATELET
Abs Immature Granulocytes: 0.06 10*3/uL (ref 0.00–0.07)
Basophils Absolute: 0 10*3/uL (ref 0.0–0.1)
Basophils Relative: 0 %
Eosinophils Absolute: 0.1 10*3/uL (ref 0.0–0.5)
Eosinophils Relative: 0 %
HCT: 28.7 % — ABNORMAL LOW (ref 36.0–46.0)
Hemoglobin: 9.2 g/dL — ABNORMAL LOW (ref 12.0–15.0)
Immature Granulocytes: 1 %
Lymphocytes Relative: 4 %
Lymphs Abs: 0.5 10*3/uL — ABNORMAL LOW (ref 0.7–4.0)
MCH: 31.2 pg (ref 26.0–34.0)
MCHC: 32.1 g/dL (ref 30.0–36.0)
MCV: 97.3 fL (ref 80.0–100.0)
Monocytes Absolute: 0.8 10*3/uL (ref 0.1–1.0)
Monocytes Relative: 6 %
Neutro Abs: 10.7 10*3/uL — ABNORMAL HIGH (ref 1.7–7.7)
Neutrophils Relative %: 89 %
Platelets: 300 10*3/uL (ref 150–400)
RBC: 2.95 MIL/uL — ABNORMAL LOW (ref 3.87–5.11)
RDW: 12.3 % (ref 11.5–15.5)
WBC: 12.2 10*3/uL — ABNORMAL HIGH (ref 4.0–10.5)
nRBC: 0 % (ref 0.0–0.2)

## 2019-06-02 LAB — COMPREHENSIVE METABOLIC PANEL
ALT: 11 U/L (ref 0–44)
AST: 13 U/L — ABNORMAL LOW (ref 15–41)
Albumin: 2.6 g/dL — ABNORMAL LOW (ref 3.5–5.0)
Alkaline Phosphatase: 82 U/L (ref 38–126)
Anion gap: 12 (ref 5–15)
BUN: 8 mg/dL (ref 6–20)
CO2: 24 mmol/L (ref 22–32)
Calcium: 8 mg/dL — ABNORMAL LOW (ref 8.9–10.3)
Chloride: 106 mmol/L (ref 98–111)
Creatinine, Ser: 0.71 mg/dL (ref 0.44–1.00)
GFR calc Af Amer: >60 mL/min (ref >60)
GFR calc non Af Amer: >60 mL/min (ref >60)
Glucose, Bld: 98 mg/dL (ref 70–99)
Potassium: 3.7 mmol/L (ref 3.5–5.1)
Sodium: 142 mmol/L (ref 135–145)
Total Bilirubin: 1.6 mg/dL — ABNORMAL HIGH (ref 0.3–1.2)
Total Protein: 6 g/dL — ABNORMAL LOW (ref 6.5–8.1)

## 2019-06-02 MED ORDER — ALBUTEROL SULFATE (2.5 MG/3ML) 0.083% IN NEBU: 2.5000 mg | INHALATION_SOLUTION | Freq: Four times a day (QID) | RESPIRATORY_TRACT | Status: DC | PRN

## 2019-06-02 MED ORDER — ALBUTEROL SULFATE HFA 108 (90 BASE) MCG/ACT IN AERS: 2.0000 | INHALATION_SPRAY | Freq: Four times a day (QID) | RESPIRATORY_TRACT | Status: DC | PRN

## 2019-06-02 MED ORDER — METHIMAZOLE 5 MG PO TABS
2.5000 mg | ORAL_TABLET | Freq: Every day | ORAL | Status: DC
  Administered 2019-06-02 – 2019-06-11 (×10): 2.5 mg via ORAL
  Filled 2019-06-02 (×11): qty 1

## 2019-06-02 MED ORDER — TRIAMCINOLONE ACETONIDE 0.1 % EX OINT: 1.0000 "application " | TOPICAL_OINTMENT | Freq: Every day | CUTANEOUS | Status: DC | PRN

## 2019-06-02 MED ORDER — PANTOPRAZOLE SODIUM 40 MG IV SOLR
40.0000 mg | INTRAVENOUS | Status: DC
  Administered 2019-06-02 – 2019-06-11 (×10): 40 mg via INTRAVENOUS
  Filled 2019-06-02 (×10): qty 40

## 2019-06-02 MED ORDER — PHENOL 1.4 % MT LIQD
1.0000 | OROMUCOSAL | Status: DC | PRN
  Filled 2019-06-02: qty 177

## 2019-06-02 NOTE — Progress Notes (Signed)
Patient is ambulating and voiding.  Pain control is good. NG tube in place. Pt got zofran this am but last pain meds were at midnight.  Vitals:   06/01/19 1515 06/01/19 1750 06/01/19 2100 06/02/19 0425  BP: 128/83 (!) 133/91 136/81 133/82  Pulse: (!) 109 (!) 104 (!) 107 (!) 107  Resp: 19 18 19 17   Temp:  99.3 F (37.4 C)  99.3 F (37.4 C)  TempSrc:  Oral  Oral  SpO2: 99% 100% 100% 100%  Weight:      Height:        lungs:   clear to auscultation cor:    RRR Abdomen:  soft, appropriate tenderness, incisions intact and without erythema or exudate. Bowel sounds present. ex:    no cords, SCDs on.   Lab Results  Component Value Date   WBC 12.2 (H) 06/02/2019   HGB 9.2 (L) 06/02/2019   HCT 28.7 (L) 06/02/2019   MCV 97.3 06/02/2019   PLT 300 06/02/2019   NG out put about 150 cc since last night.  A/P  POD #8 robotic TLH/salpingectomies now with multiple intraabdominal abscesses.  I saw pt yesterday afternoon and d/w her the plan of care. 1.  Zosyn day #1.  Will do IV antibiotics to at least 48 hours afebrile but may continue for at least one day post. 2.  I d/w Interventional [facility] yesterday and they reviewed CT.  None of the abscesses are truly big enough for them to drain at this point.  Plan will be to repeat CT at 3 days afeb to see if the abscesses are improving. 3.  Ileus- likely per radiology that it is from the inflammation.  Will keep NG tube in place for now as pt is reluctant to remove and have to replace.  Plan would be to remove if bowels are moving, output is way down or if the CT shows resolution of ileus. 4.  Mild anemia- will recheck in a few days and consider iron infusion if going down. 5.  Graves disease: pt has not had methismazole since before admission.  TSH was normal on admit.  Will restart.

## 2019-06-02 NOTE — Progress Notes (Signed)
2/4 GNR anaerobic - BCID none detected  PHARMACY - PHYSICIAN COMMUNICATION CRITICAL VALUE ALERT - BLOOD CULTURE IDENTIFICATION (BCID)  Deborah Murphy is an 43 y.o. female who presented to [facility] on 06/01/2019 with a chief complaint of fevers after recent hysterectomy  Assessment: 13 YOF on antibiotics for intra-abdominal infection with abscesses. Now with 2 of 4 blood cultures (anerobic only) growing GNR with BCID undetected - this could represent something such as Bacteroides but will have to await further identification.   Name of physician (or Provider) Contacted: Philis Pique  Current antibiotics: Zosyn  Changes to prescribed antibiotics recommended:  None- continue Zosyn pending further culture results  Results for orders placed or performed during the hospital encounter of 06/01/19  Blood Culture ID Panel (Reflexed) (Collected: 06/01/2019  2:48 AM)  Result Value Ref Range   Enterococcus species NOT DETECTED NOT DETECTED   Listeria monocytogenes NOT DETECTED NOT DETECTED   Staphylococcus species NOT DETECTED NOT DETECTED   Staphylococcus aureus (BCID) NOT DETECTED NOT DETECTED   Streptococcus species NOT DETECTED NOT DETECTED   Streptococcus agalactiae NOT DETECTED NOT DETECTED   Streptococcus pneumoniae NOT DETECTED NOT DETECTED   Streptococcus pyogenes NOT DETECTED NOT DETECTED   Acinetobacter baumannii NOT DETECTED NOT DETECTED   Enterobacteriaceae species NOT DETECTED NOT DETECTED   Enterobacter cloacae complex NOT DETECTED NOT DETECTED   Escherichia coli NOT DETECTED NOT DETECTED   Klebsiella oxytoca NOT DETECTED NOT DETECTED   Klebsiella pneumoniae NOT DETECTED NOT DETECTED   Proteus species NOT DETECTED NOT DETECTED   Serratia marcescens NOT DETECTED NOT DETECTED   Haemophilus influenzae NOT DETECTED NOT DETECTED   Neisseria meningitidis NOT DETECTED NOT DETECTED   Pseudomonas aeruginosa NOT DETECTED NOT DETECTED   Candida albicans NOT DETECTED NOT DETECTED   Candida  glabrata NOT DETECTED NOT DETECTED   Candida krusei NOT DETECTED NOT DETECTED   Candida parapsilosis NOT DETECTED NOT DETECTED   Candida tropicalis NOT DETECTED NOT DETECTED    Alycia Rossetti, PharmD, BCPS Pager: [phone] 9:28 AM

## 2019-06-03 LAB — URINE CULTURE: Culture: 50000 — AB

## 2019-06-03 NOTE — Progress Notes (Signed)
Patient is  Ambulating and voiding.  NG in place. Pain control is good.  Vitals:   06/02/19 0425 06/02/19 1550 06/02/19 2134 06/03/19 0547  BP: 133/82 (!) 144/89 (!) 142/85 130/76  Pulse: (!) 107 (!) 105 (!) 101 (!) 108  Resp: 17 18 19    Temp: 99.3 F (37.4 C) 98.4 F (36.9 C) 98.6 F (37 C) 99 F (37.2 C)  TempSrc: Oral Oral    SpO2: 100% 100% 100% 100%  Weight:      Height:       NG output total yesterday was 600cc  lungs:   clear to auscultation cor:    RRR Abdomen:  soft, appropriate tenderness, incisions intact and without erythema or exudate. ex:    no cords   Lab Results  Component Value Date   WBC 12.2 (H) 06/02/2019   HGB 9.2 (L) 06/02/2019   HCT 28.7 (L) 06/02/2019   MCV 97.3 06/02/2019   PLT 300 06/02/2019   Blood cultures: Aerobic negative for all Anaerobic: positive for gram neg rods, ID pending.  Urine Culture:  Enterococcus faecalis, sensitive to Amp.  A/P HD #2 POD #9 TLH/salpingectomies with multiple abdominal abscesses, positive blood cultures for anaerobic gram neg rods, and UTI.  1.  Continue Zosyn- per pharm, should cover what is growing out of blood.  Pt has been afebrile for almost 48 hours.  Tomorrow will d/w ID whether they think we should place [facility] line for continued IV or if they believe PO will be ok.  Will also repeat CT tomorrow to see status of ileus and abscesses.  2.  Continue NG for now until CT or NG output falls. 3.  Graves- back on methimazole.   4.  Mild anemia, check in 2 days.

## 2019-06-04 ENCOUNTER — Inpatient Hospital Stay (HOSPITAL_COMMUNITY): Payer: BC Managed Care – PPO

## 2019-06-04 LAB — CULTURE, BLOOD (ROUTINE X 2): Special Requests: ADEQUATE

## 2019-06-04 MED ORDER — MIDAZOLAM HCL 2 MG/2ML IJ SOLN
INTRAMUSCULAR | Status: AC
  Filled 2019-06-04: qty 2

## 2019-06-04 MED ORDER — IOHEXOL 300 MG/ML  SOLN
100.0000 mL | Freq: Once | INTRAMUSCULAR | Status: AC | PRN
  Administered 2019-06-04: 100 mL via INTRAVENOUS

## 2019-06-04 MED ORDER — LIDOCAINE HCL 1 % IJ SOLN
INTRAMUSCULAR | Status: AC
  Filled 2019-06-04: qty 20

## 2019-06-04 MED ORDER — MIDAZOLAM HCL 2 MG/2ML IJ SOLN
INTRAMUSCULAR | Status: AC | PRN
  Administered 2019-06-04 (×2): 0.5 mg via INTRAVENOUS
  Administered 2019-06-04: 1 mg via INTRAVENOUS

## 2019-06-04 MED ORDER — FENTANYL CITRATE (PF) 100 MCG/2ML IJ SOLN
INTRAMUSCULAR | Status: AC
  Filled 2019-06-04: qty 2

## 2019-06-04 MED ORDER — FENTANYL CITRATE (PF) 100 MCG/2ML IJ SOLN
INTRAMUSCULAR | Status: AC | PRN
  Administered 2019-06-04: 25 ug via INTRAVENOUS
  Administered 2019-06-04: 50 ug via INTRAVENOUS
  Administered 2019-06-04: 25 ug via INTRAVENOUS

## 2019-06-04 NOTE — Procedures (Signed)
Interventional Radiology Procedure Note  Procedure: Placement of a 28F drainage catheter via a RIGHT transgluteal approach with aspiration of 15-20 mL old, dark blood.  Fluid most consistent with liquefied hematoma.  Sample sent for culture.   Complications: None  Estimated Blood Loss: None  Recommendations: - Drain to JP bulb - Flush Q shift - When out-put is scant (no more than 5 mL over flush volume) for 48 hrs, may DC tube.   Signed,  Criselda Peaches, MD

## 2019-06-04 NOTE — Progress Notes (Addendum)
CT results as follows:  IMPRESSION: 1. The dominant abscess anterior to the rectum has increased in size compared to the prior exam, currently 48 cubic cm and previously 14 cubic cm. Other smaller abscesses are scattered in the pelvis. 2. Dilated loops of proximal small bowel extending down to a loop of distal jejunum or proximal ileum which takes an angulated course in the mid abdomen at the level of the iliac crests, and then courses inferiorly into the reticular network of pelvic abscesses. Possibilities include adhesion causing partial small bowel obstruction, versus ileus related to the abscesses. 3. Reduced volume of free intraperitoneal gas (but not resolved). Reduced volume of subcutaneous edema in the groin, bilateral abdomen, and pelvis. 4. Faint reticulonodular opacity in the right lower lobe, probably related to atypical infectious process. 5. Gallbladder wall thickening is nonspecific and could be from inflammation, hypoproteinemia, contraction, or hypoproteinemia.  Anaerobic culture grew out Bacteroides fragilis.   Given that partial small bowel obstruction is present still, I have asked Gen Surg to consult and give an opinion as to if the process there is not likely to resolve with just drainage- they believe IR drainage should help and there is no acute surgical issue right now.   I have also called IR and they are prepared to drain largest abscess now this pm.  A side consult with ID suggested at least 10 days of IV antibiotics and a once a day dosing if pt is home before that 10 days is up.    I have d/w pt all of above and will repeat CBC, CMET in am.  I plan on replacing her IV with a PICC line in the next day or two.  '  Deborah Murphy

## 2019-06-04 NOTE — Progress Notes (Signed)
Patient is ambulating and voiding.  Pain control is good.  NG in place.    Vitals:   06/03/19 0547 06/03/19 1436 06/03/19 2128 06/04/19 0644  BP: 130/76 (!) 142/89 136/80 (!) 135/92  Pulse: (!) 108 (!) 114 (!) 102 (!) 101  Resp:  18    Temp: 99 F (37.2 C) 99.6 F (37.6 C) 99.6 F (37.6 C) 99.8 F (37.7 C)  TempSrc:  Oral Oral Oral  SpO2: 100% 100% 100% 100%  Weight:      Height:        lungs:   clear to auscultation cor:    RRR Abdomen:  soft, appropriate tenderness, incisions intact and without erythema or exudate.  BS excellent. ex:    no cords   NG output 250 cc in 24 hours.  Lab Results  Component Value Date   WBC 12.2 (H) 06/02/2019   HGB 9.2 (L) 06/02/2019   HCT 28.7 (L) 06/02/2019   MCV 97.3 06/02/2019   PLT 300 06/02/2019   Blood cultures: Aerobic negative for all Anaerobic: positive for gram neg rods, ID pending.  Urine Culture:  Enterococcus faecalis, sensitive to Amp.  A/P HD #3 POD #10 TLH/salpingectomies with multiple abdominal abscesses, positive blood cultures for anaerobic gram neg rods, and UTI.  1.  Continue Zosyn- per pharm, should cover what is growing out of blood.  Pt has been afebrile for 72 hours.  Today will d/w ID whether they think we should place [facility] line for continued IV or if they believe PO will be ok after repeat CT done to see status of ileus and abscesses.  2.  Continue NG for now until CT or NG output falls. 3.  Graves- back on methimazole.   4.  Mild anemia, check tomorrow.

## 2019-06-04 NOTE — Consult Note (Signed)
[facility] Surgery Consult Note  Deborah Murphy Nov 13, 1975  [id number].    Requesting MD: Bobbye Charleston Chief Complaint/Reason for Consult: pelvic abscesses  HPI:  Deborah Murphy is a 43yo female PMH Graves disease who is s/p xi robotic assisted laparoscopic assisted laparoscopic hysterectomy and bilateral salpingectomy for fibroids on 05/25/2019 by Dr. Philis Pique. Per report surgery was uncomplicated. Patient returned to the ED 12/18 complaining of worsening nausea, abdominal pain and fever TMAX 103. In the ED she underwent CT scan which showed complex collections of fluid and gas throughout the pelvis and abdomen consistent with developing abscesses; SBO with transition in the pelvis likely secondary to the diffuse inflammatory changes; pneumoperitoneum, extensive subcutaneous gas over the low anatomic pelvis, into the thighs, and over the left side of the abdomen and lower chest wall. WBC 15.3. Blood cultures positive for bacteroides fragilis and beta lactamase positive. Patient was admitted to the hospital, NG tube placed for decompression, and she was started on IV zosyn. IR was consulted at that time but was unable to drain any of the fluid collections. She reports since that time her abdominal pain has almost subsided. She is passing flatus and has been having diarrhea 1-2 times per day. She denies any nausea. She has been afebrile x72 hours, but has not had any repeat labwork in 2 days. Primary team d/w ID who recommended a repeat CT scan today which showed dominant abscess anterior to the rectum has increased in size 5.2 by 4.5 by 3.9 cm, as well as other smaller abscesses scattered in the pelvis; ileus versus adhesion causing partial small bowel obstruction. General surgery asked to see today. Other abdominal surgeries include c-section. She is not on any blood thinners. Pmhx includes graves disease on Methimazole daily.   ROS: Review of Systems  Constitutional: Positive for chills and  fever.  Respiratory: Negative for cough.   Cardiovascular: Negative for palpitations.  Gastrointestinal: Positive for abdominal pain, diarrhea, nausea and vomiting. Negative for constipation.  Genitourinary: Negative for dysuria, frequency, hematuria and urgency.  All other systems reviewed and are negative.   Family History  Problem Relation Age of Onset  . Hypertension Mother   . Diabetes Mother     Past Medical History:  Diagnosis Date  . Graves disease   . S/P endometrial ablation     Past Surgical History:  Procedure Laterality Date  . CESAREAN SECTION    . CYSTOSCOPY N/A 05/25/2019   Procedure: CYSTOSCOPY;  Surgeon: Bobbye Charleston, MD;  Location: [facility];  Service: Gynecology;  Laterality: N/A;  . ROBOTIC ASSISTED LAPAROSCOPIC HYSTERECTOMY AND SALPINGECTOMY Bilateral 05/25/2019   Procedure: XI ROBOTIC ASSISTED LAPAROSCOPIC HYSTERECTOMY AND SALPINGECTOMY;  Surgeon: Bobbye Charleston, MD;  Location: [place];  Service: Gynecology;  Laterality: Bilateral;  . TUBAL LIGATION      Social History:  reports that she has never smoked. She has never used smokeless tobacco. She reports that she does not drink alcohol or use drugs.  Allergies: No Known Allergies  Medications Prior to Admission  Medication Sig Dispense Refill  . albuterol (VENTOLIN HFA) 108 (90 Base) MCG/ACT inhaler Inhale 2 puffs into the lungs every 6 (six) hours as needed for wheezing or shortness of breath.    . esomeprazole (NEXIUM) 20 MG capsule Take 20 mg by mouth every evening.    . methimazole (TAPAZOLE) 5 MG tablet Take 0.5 tablets (2.5 mg total) by mouth daily. 30 tablet 6  . oxyCODONE-acetaminophen (PERCOCET/ROXICET) 5-325 MG tablet Take 1-2 tablets by  mouth every 4 (four) hours as needed for severe pain. (Patient taking differently: Take 1 tablet by mouth 2 (two) times daily as needed for severe pain. ) 30 tablet 0  . triamcinolone ointment (KENALOG) 0.1 % Apply 1  application topically daily as needed for irritation.    . benzonatate (TESSALON) 200 MG capsule Take 1 capsule (200 mg total) by mouth 3 (three) times daily as needed for cough. (Patient not taking: Reported on 06/01/2019) 90 capsule 1    Prior to Admission medications   Medication Sig Start Date End Date Taking? Authorizing Provider  albuterol (VENTOLIN HFA) 108 (90 Base) MCG/ACT inhaler Inhale 2 puffs into the lungs every 6 (six) hours as needed for wheezing or shortness of breath.   Yes [provider]  esomeprazole (NEXIUM) 20 MG capsule Take 20 mg by mouth every evening.   Yes [provider]  methimazole (TAPAZOLE) 5 MG tablet Take 0.5 tablets (2.5 mg total) by mouth daily. 09/18/18  Yes Shamleffer, Melanie Crazier, MD  oxyCODONE-acetaminophen (PERCOCET/ROXICET) 5-325 MG tablet Take 1-2 tablets by mouth every 4 (four) hours as needed for severe pain. Patient taking differently: Take 1 tablet by mouth 2 (two) times daily as needed for severe pain.  05/25/19  Yes Bobbye Charleston, MD  triamcinolone ointment (KENALOG) 0.1 % Apply 1 application topically daily as needed for irritation. 02/06/19  Yes [provider]  benzonatate (TESSALON) 200 MG capsule Take 1 capsule (200 mg total) by mouth 3 (three) times daily as needed for cough. Patient not taking: Reported on 06/01/2019 01/23/19   Collene Gobble, MD    Blood pressure (!) 150/100, pulse 97, temperature 98.8 F (37.1 C), temperature source Oral, resp. rate 18, height 5\' 6"  (1.676 m), weight 59 kg, SpO2 100 %. Physical Exam: General: pleasant, WD/WN female who is laying in bed in NAD HEENT: head is normocephalic, atraumatic.  Sclera are noninjected.  Pupils equal and round.  Ears and nose without any masses or lesions.  Mouth is pink and moist. Dentition fair. NGT in place on LIWS with ~400cc of bile in cannister.  Heart: regular, rate, and rhythm.  No obvious murmurs, gallops, or rubs noted.  Palpable pedal pulses  bilaterally Lungs: CTAB, no wheezes, rhonchi, or rales noted.  Respiratory effort nonlabored Abd: Soft, ND, essentially NT with minimal pain with palpation to the lower abdomen on deep palpation. No rebound, rigidity or guarding. Incisions with glue intact appears well and are without drainage, bleeding, or signs of infection. No hernias, or organomegaly MS: calves soft and nontender Skin: warm and dry with no masses, lesions, or rashes Psych: A&Ox3 with an appropriate affect. Neuro: cranial nerves grossly intact, extremity CSM intact bilaterally, normal speech  No results found for this or any previous visit (from the past 48 hour(s)). CT ABDOMEN PELVIS W CONTRAST  Result Date: 06/04/2019 CLINICAL DATA:  Pelvic abscesses. Partial bowel obstruction. EXAM: CT ABDOMEN AND PELVIS WITH CONTRAST TECHNIQUE: Multidetector CT imaging of the abdomen and pelvis was performed using the standard protocol following bolus administration of intravenous contrast. CONTRAST:  149mL OMNIPAQUE IOHEXOL 300 MG/ML  SOLN COMPARISON:  06/01/2019 FINDINGS: Lower chest: Faint reticulonodular opacity in the right lower lobe on image 1/4, probably related to atypical infectious process. Hepatobiliary: Gallbladder wall thickening is present. Dependent density in the gallbladder likely from sludge. Pancreas: Unremarkable Spleen: Unremarkable Adrenals/Urinary Tract: Unremarkable Stomach/Bowel: The orally administered contrast is only in dilated proximal loops of small bowel demonstrates progressive dilution in dilated loops of distal  jejunum site of obstruction difficult to be certain of but probably in the vicinity of an angulated loop in the mid abdomen in the small bowel. There is a network of pelvic abscesses difficult to separate from small bowel loops. A representative abscess anterior to the rectum measures 5.2 by 4.5 by 3.9 cm (volume = 48 cm^3), previously 3.6 by 2.4 by 3.1 cm (volume = 14 cm^3). Other smaller abscesses and  collections of free fluid are present in the pelvis, with fluid and a small amount of gas tracking along the pelvic sidewalls and right groin, and with free air along the right hepatic lobe margin although reduced in size compared to previous. In addition, the amount of subcutaneous edema tracking along the groin, bilateral abdomen, and in the deep tissues the left breast is reduced compared to prior. Vascular/Lymphatic: Unremarkable Reproductive: Prior hysterectomy. Other: Stranding in the mesentery. Musculoskeletal: Unremarkable IMPRESSION: 1. The dominant abscess anterior to the rectum has increased in size compared to the prior exam, currently 48 cubic cm and previously 14 cubic cm. Other smaller abscesses are scattered in the pelvis. 2. Dilated loops of proximal small bowel extending down to a loop of distal jejunum or proximal ileum which takes an angulated course in the mid abdomen at the level of the iliac crests, and then courses inferiorly into the reticular network of pelvic abscesses. Possibilities include adhesion causing partial small bowel obstruction, versus ileus related to the abscesses. 3. Reduced volume of free intraperitoneal gas (but not resolved). Reduced volume of subcutaneous edema in the groin, bilateral abdomen, and pelvis. 4. Faint reticulonodular opacity in the right lower lobe, probably related to atypical infectious process. 5. Gallbladder wall thickening is nonspecific and could be from inflammation, hypoproteinemia, contraction, or hypoproteinemia. Electronically Signed   By: Van Clines M.D.   On: 06/04/2019 12:31   Anti-infectives (From admission, onward)   Start     Dose/Rate Route Frequency Ordered Stop   06/01/19 0600  piperacillin-tazobactam (ZOSYN) IVPB 3.375 g     3.375 g 12.5 mL/hr over 240 Minutes Intravenous Every 8 hours 06/01/19 0521     06/01/19 0245  ceFEPIme (MAXIPIME) 2 g in sodium chloride 0.9 % 100 mL IVPB     2 g 200 mL/hr over 30 Minutes  Intravenous  Once 06/01/19 0244 06/01/19 0451   06/01/19 0245  metroNIDAZOLE (FLAGYL) IVPB 500 mg     500 mg 100 mL/hr over 60 Minutes Intravenous  Once 06/01/19 0244 06/01/19 0451       Assessment/Plan Graves disease  Pelvic abscesses Ileus vs pSBO Bacteremia s/p XI ROBOTIC ASSISTED LAPAROSCOPIC HYSTERECTOMY AND SALPINGECTOMY (Bilateral), CYSTOSCOPY for fibroids on 05/25/2019 by Dr. Philis Pique - No peritonitis on exam. No role for emergent surgical intervention - Recommend re-consultation to IR for possible drainage  - Continue IV abx - Recheck labs - Keep K>4 and Mg>2 for bowel function - Mobilize for bowel function - We will follow  FEN - IVF, NPO/NGT ID - zosyn 12/18>> VTE - SCDs, okay for chemical prophylaxis from a general surgery standpoint Foley - none Follow up - Dr. Chauncey Cruel, [facility] Surgery 06/04/2019, 4:14 PM Please see Amion for pager number during day hours 7:00am-4:30pm

## 2019-06-04 NOTE — Progress Notes (Signed)
Chief Complaint: Patient was seen in consultation today for post op pelvic abscess   Referring Physician(s): Dr. Bobbye Charleston  Supervising Physician: Jacqulynn Cadet  Patient Status: [facility] - In-pt  History of Present Illness: Deborah Murphy is a 43 y.o. female who underwent robotic assisted lap hysterectomy and BSO on 12/11 She developed post op abscesses and has been on IV abx. Repeat CT however shows interval increase in size of abscess. IR is asked to place perc drain PMHx, meds, labs, imaging, allergies reviewed. Has been NPO today as directed.    Past Medical History:  Diagnosis Date  . Graves disease   . S/P endometrial ablation     Past Surgical History:  Procedure Laterality Date  . CESAREAN SECTION    . CYSTOSCOPY N/A 05/25/2019   Procedure: CYSTOSCOPY;  Surgeon: Bobbye Charleston, MD;  Location: [facility];  Service: Gynecology;  Laterality: N/A;  . ROBOTIC ASSISTED LAPAROSCOPIC HYSTERECTOMY AND SALPINGECTOMY Bilateral 05/25/2019   Procedure: XI ROBOTIC ASSISTED LAPAROSCOPIC HYSTERECTOMY AND SALPINGECTOMY;  Surgeon: Bobbye Charleston, MD;  Location: [place];  Service: Gynecology;  Laterality: Bilateral;  . TUBAL LIGATION      Allergies: Patient has no known allergies.  Medications:  Current Facility-Administered Medications:  .  albuterol (PROVENTIL) (2.5 MG/3ML) 0.083% nebulizer solution 2.5 mg, 2.5 mg, Nebulization, Q6H PRN, Bobbye Charleston, MD .  HYDROmorphone (DILAUDID) injection 0.2-0.6 mg, 0.2-0.6 mg, Intravenous, Q2H PRN, Bobbye Charleston, MD, 0.6 mg at 06/02/19 2146 .  ibuprofen (ADVIL) tablet 800 mg, 800 mg, Oral, Q8H PRN, Bobbye Charleston, MD .  lactated ringers infusion, , Intravenous, Continuous, Bobbye Charleston, MD, Last Rate: 125 mL/hr at 06/04/19 0952, New Bag at 06/04/19 0952 .  methimazole (TAPAZOLE) tablet 2.5 mg, 2.5 mg, Oral, Daily, Bobbye Charleston, MD, 2.5 mg at 06/04/19 0944 .   ondansetron (ZOFRAN) tablet 4 mg, 4 mg, Oral, Q6H PRN **OR** ondansetron (ZOFRAN) injection 4 mg, 4 mg, Intravenous, Q6H PRN, Bobbye Charleston, MD, 4 mg at 06/02/19 1147 .  oxyCODONE-acetaminophen (PERCOCET/ROXICET) 5-325 MG per tablet 1-2 tablet, 1-2 tablet, Oral, Q3H PRN, Bobbye Charleston, MD .  pantoprazole (PROTONIX) injection 40 mg, 40 mg, Intravenous, Q24H, Bobbye Charleston, MD, 40 mg at 06/04/19 0945 .  phenol (CHLORASEPTIC) mouth spray 1 spray, 1 spray, Mouth/Throat, PRN, Bobbye Charleston, MD .  piperacillin-tazobactam (ZOSYN) IVPB 3.375 g, 3.375 g, Intravenous, Q8H, Bobbye Charleston, MD, Last Rate: 12.5 mL/hr at 06/04/19 1438, 3.375 g at 06/04/19 1438 .  prenatal multivitamin tablet 1 tablet, 1 tablet, Oral, Q1200, Bobbye Charleston, MD, 1 tablet at 06/04/19 0945 .  triamcinolone ointment (KENALOG) 0.1 % 1 application, 1 application, Topical, Daily PRN, Bobbye Charleston, MD    Family History  Problem Relation Age of Onset  . Hypertension Mother   . Diabetes Mother     Social History   Socioeconomic History  . Marital status: Single    Spouse name: Not on file  . Number of children: Not on file  . Years of education: Not on file  . Highest education level: Not on file  Occupational History  . Not on file  Tobacco Use  . Smoking status: Never Smoker  . Smokeless tobacco: Never Used  Substance and Sexual Activity  . Alcohol use: No  . Drug use: No  . Sexual activity: Yes    Birth control/protection: Surgical  Other Topics Concern  . Not on file  Social History Narrative  . Not on file   Social Determinants of Health   Financial  Resource Strain:   . Difficulty of Paying Living Expenses: Not on file  Food Insecurity:   . Worried About Charity fundraiser in the Last Year: Not on file  . Ran Out of Food in the Last Year: Not on file  Transportation Needs:   . Lack of Transportation (Medical): Not on file  . Lack of Transportation (Non-Medical): Not on file   Physical Activity:   . Days of Exercise per Week: Not on file  . Minutes of Exercise per Session: Not on file  Stress:   . Feeling of Stress : Not on file  Social Connections:   . Frequency of Communication with Friends and Family: Not on file  . Frequency of Social Gatherings with Friends and Family: Not on file  . Attends Religious Services: Not on file  . Active Member of Clubs or Organizations: Not on file  . Attends Archivist Meetings: Not on file  . Marital Status: Not on file     Review of Systems: A 12 point ROS discussed and pertinent positives are indicated in the HPI above.  All other systems are negative.  Review of Systems  Vital Signs: BP (!) 150/100 (BP Location: Left Arm)   Pulse 97   Temp 98.8 F (37.1 C) (Oral)   Resp 18   Ht 5\' 6"  (1.676 m)   Wt 59 kg   LMP  (LMP Unknown)   SpO2 100%   BMI 20.98 kg/m   Physical Exam Constitutional:      Appearance: Normal appearance.  HENT:     Nose:     Comments: NGT intact    Mouth/Throat:     Mouth: Mucous membranes are moist.     Pharynx: Oropharynx is clear.  Cardiovascular:     Rate and Rhythm: Normal rate and regular rhythm.     Heart sounds: Normal heart sounds.  Pulmonary:     Effort: Pulmonary effort is normal. No respiratory distress.     Breath sounds: Normal breath sounds.  Abdominal:     Palpations: Abdomen is soft.     Comments: Mildly distended and tender without peritoneal signs  Skin:    General: Skin is warm and dry.  Neurological:     General: No focal deficit present.     Mental Status: She is alert and oriented to person, place, and time.  Psychiatric:        Mood and Affect: Mood normal.        Thought Content: Thought content normal.        Judgment: Judgment normal.      Imaging: DG Abd 1 View  Result Date: 06/01/2019 CLINICAL DATA:  NG tube placement. EXAM: ABDOMEN - 1 VIEW COMPARISON:  CT 06/01/2019. FINDINGS: NG tube noted with tip coiled in stomach.  Distended loops of small bowel again noted. Reference is made to CT report 06/01/2019 for further discussion of small-bowel distention and free air. Contrast noted in the renal collecting systems and bladder from prior CT. No acute bony abnormality. IMPRESSION: NG tube noted with tip coiled in stomach. Distended loops of small bowel again noted. Reference is made to CT report 06/01/2023 for further discussion of small-bowel distention and free air. Electronically Signed   By: Marcello Moores  Register   On: 06/01/2019 06:33   CT ABDOMEN PELVIS W CONTRAST  Result Date: 06/04/2019 CLINICAL DATA:  Pelvic abscesses. Partial bowel obstruction. EXAM: CT ABDOMEN AND PELVIS WITH CONTRAST TECHNIQUE: Multidetector CT imaging of the abdomen and  pelvis was performed using the standard protocol following bolus administration of intravenous contrast. CONTRAST:  142mL OMNIPAQUE IOHEXOL 300 MG/ML  SOLN COMPARISON:  06/01/2019 FINDINGS: Lower chest: Faint reticulonodular opacity in the right lower lobe on image 1/4, probably related to atypical infectious process. Hepatobiliary: Gallbladder wall thickening is present. Dependent density in the gallbladder likely from sludge. Pancreas: Unremarkable Spleen: Unremarkable Adrenals/Urinary Tract: Unremarkable Stomach/Bowel: The orally administered contrast is only in dilated proximal loops of small bowel demonstrates progressive dilution in dilated loops of distal jejunum site of obstruction difficult to be certain of but probably in the vicinity of an angulated loop in the mid abdomen in the small bowel. There is a network of pelvic abscesses difficult to separate from small bowel loops. A representative abscess anterior to the rectum measures 5.2 by 4.5 by 3.9 cm (volume = 48 cm^3), previously 3.6 by 2.4 by 3.1 cm (volume = 14 cm^3). Other smaller abscesses and collections of free fluid are present in the pelvis, with fluid and a small amount of gas tracking along the pelvic sidewalls and  right groin, and with free air along the right hepatic lobe margin although reduced in size compared to previous. In addition, the amount of subcutaneous edema tracking along the groin, bilateral abdomen, and in the deep tissues the left breast is reduced compared to prior. Vascular/Lymphatic: Unremarkable Reproductive: Prior hysterectomy. Other: Stranding in the mesentery. Musculoskeletal: Unremarkable IMPRESSION: 1. The dominant abscess anterior to the rectum has increased in size compared to the prior exam, currently 48 cubic cm and previously 14 cubic cm. Other smaller abscesses are scattered in the pelvis. 2. Dilated loops of proximal small bowel extending down to a loop of distal jejunum or proximal ileum which takes an angulated course in the mid abdomen at the level of the iliac crests, and then courses inferiorly into the reticular network of pelvic abscesses. Possibilities include adhesion causing partial small bowel obstruction, versus ileus related to the abscesses. 3. Reduced volume of free intraperitoneal gas (but not resolved). Reduced volume of subcutaneous edema in the groin, bilateral abdomen, and pelvis. 4. Faint reticulonodular opacity in the right lower lobe, probably related to atypical infectious process. 5. Gallbladder wall thickening is nonspecific and could be from inflammation, hypoproteinemia, contraction, or hypoproteinemia. Electronically Signed   By: Van Clines M.D.   On: 06/04/2019 12:31   CT Abdomen Pelvis W Contrast  Result Date: 06/01/2019 CLINICAL DATA:  Acute abdominal pain. Status post hysterectomy 7 days ago. Fever. Nausea and vomiting. EXAM: CT ABDOMEN AND PELVIS WITH CONTRAST TECHNIQUE: Multidetector CT imaging of the abdomen and pelvis was performed using the standard protocol following bolus administration of intravenous contrast. CONTRAST:  148mL OMNIPAQUE IOHEXOL 300 MG/ML  SOLN COMPARISON:  None. FINDINGS: Lower chest: The lung bases are clear without focal  nodule, mass, or airspace disease. Heart size is normal. Hepatobiliary: No focal liver abnormality is seen. No gallstones, gallbladder wall thickening, or biliary dilatation. Pancreas: Unremarkable. No pancreatic ductal dilatation or surrounding inflammatory changes. Spleen: Normal in size without focal abnormality. Adrenals/Urinary Tract: Adrenal glands are normal bilaterally. Kidneys and ureters are within normal limits. Diffuse wall thickening is present about the urinary bladder, likely secondary inflammation. No discrete lesion is present. Stomach/Bowel: The stomach and duodenum are within normal limits. The small bowel is diffusely dilated. Loops measure up to 3.2 cm. Dilated bowel extends into the anatomic pelvis. The colon is relatively collapsed. Vascular/Lymphatic: Subcentimeter retroperitoneal lymph nodes are likely reactive. Focal vascular lesions are present. Reproductive: Patient  is status post hysterectomy. Other: Extensive free air is present throughout the abdomen. Multiple developing fluid collections are present throughout the abdomen and pelvis. The largest is within the anatomic pelvis, in the cul-de-sac, likely accessible for percutaneous drainage. This collection measures 4.6 x 2.6 x 2.6 cm. The collection tracks anteriorly along the right side of the pelvis. A second collection is present near the cecum. An anterior collection is noted just above the urinary bladder which may be accessible for percutaneous drainage. This collection measures 3.3 x 1.7 x 2.4 cm. Other smaller collections are more difficult to access. A collection of gas and fluid present near the gallbladder on image 27 and 28/2. This is likely a separate collection from bowel. Does not appear to be accessible. Extensive subcutaneous emphysema is present in addition to the pneumoperitoneum. There is subcutaneous gas over the lower anatomic pelvis and extending into the upper thighs bilaterally. Gas extends of the left side of  the abdomen to the chest wall and below the left breast. Musculoskeletal: No acute or significant osseous findings. IMPRESSION: 1. Complex collections of fluid and gas throughout the pelvis and abdomen consistent with developing abscesses. At least 2 of these areas would be amenable for percutaneous drainage. 2. Small bowel obstruction with transition in the pelvis likely secondary to the diffuse inflammatory changes. 3. In addition to pneumoperitoneum, there is extensive subcutaneous gas over the low anatomic pelvis, into the thighs, and over the left side of the abdomen and lower chest wall. These results were called by telephone at the time of interpretation on 06/01/2019 at 4:35 am to provider Addison Lank, MD, who verbally acknowledged these results. Electronically Signed   By: San Morelle M.D.   On: 06/01/2019 04:48   DG Chest Port 1 View  Result Date: 06/01/2019 CLINICAL DATA:  Fever and tachycardia. Hysterectomy 05/25/2019. Increasing pain. EXAM: PORTABLE CHEST 1 VIEW COMPARISON:  Chest radiograph 11/09/2018 FINDINGS: The cardiomediastinal contours are normal. The lungs are clear. Pulmonary vasculature is normal. No consolidation, pleural effusion, or pneumothorax. No acute osseous abnormalities are seen. Free air under the right hemidiaphragm. IMPRESSION: 1. No acute chest findings. 2. Free air under the right hemidiaphragm. While this may be related to recent abdominal surgery, consider further evaluation with abdominopelvic CT for further evaluation based on clinical concern. Electronically Signed   By: Keith Rake M.D.   On: 06/01/2019 02:25    Labs:  CBC: Recent Labs    08/18/18 0819 05/23/19 1005 06/01/19 0228 06/02/19 0401  WBC 4.0 5.0 15.3* 12.2*  HGB 12.9 12.0 11.3* 9.2*  HCT 38.4 37.5 35.1* 28.7*  PLT 211.0 207 290 300    COAGS: Recent Labs    06/01/19 0243  INR 1.1  APTT 35    BMP: Recent Labs    08/18/18 0819 06/01/19 0228 06/02/19 0401  NA 136  137 142  K 4.3 3.6 3.7  CL 104 101 106  CO2 26 24 24   GLUCOSE 85 139* 98  BUN 13 11 8   CALCIUM 8.7 8.9 8.0*  CREATININE 0.80 0.77 0.71  GFRNONAA  --  >60 >60  GFRAA  --  >60 >60    LIVER FUNCTION TESTS: Recent Labs    08/18/18 0819 06/01/19 0228 06/02/19 0401  BILITOT 0.7 2.7* 1.6*  AST 20 14* 13*  ALT 24 14 11   ALKPHOS 49 81 82  PROT 7.2 8.1 6.0*  ALBUMIN 4.5 3.9 2.6*    TUMOR MARKERS: No results for input(s): AFPTM, CEA, CA199, CHROMGRNA in  the last 8760 hours.  Assessment and Plan: S/p robotic lap TH and BSO Post op pelvic abscesses and ileus Imaging reviewed Plan for CT guided perc drain(s) Risks and benefits discussed with the patient including bleeding, infection, damage to adjacent structures, bowel perforation/fistula connection, and sepsis.  All of the patient's questions were answered, patient is agreeable to proceed. Consent signed and in chart.    Thank you for this interesting consult.  I greatly enjoyed meeting Deborah Murphy and look forward to participating in their care.  A copy of this report was sent to the requesting provider on this date.  Electronically Signed: Ascencion Dike, PA-C 06/04/2019, 5:14 PM   I spent a total of 20 minutes in face to face in clinical consultation, greater than 50% of which was counseling/coordinating care for pelvic abscess drains

## 2019-06-05 ENCOUNTER — Inpatient Hospital Stay: Payer: Self-pay

## 2019-06-05 LAB — PHOSPHORUS: Phosphorus: 3.3 mg/dL (ref 2.5–4.6)

## 2019-06-05 LAB — CBC
HCT: 24.5 % — ABNORMAL LOW (ref 36.0–46.0)
Hemoglobin: 7.8 g/dL — ABNORMAL LOW (ref 12.0–15.0)
MCH: 30.7 pg (ref 26.0–34.0)
MCHC: 31.8 g/dL (ref 30.0–36.0)
MCV: 96.5 fL (ref 80.0–100.0)
Platelets: 285 10*3/uL (ref 150–400)
RBC: 2.54 MIL/uL — ABNORMAL LOW (ref 3.87–5.11)
RDW: 12.4 % (ref 11.5–15.5)
WBC: 10.1 10*3/uL (ref 4.0–10.5)
nRBC: 0 % (ref 0.0–0.2)

## 2019-06-05 LAB — URINALYSIS, ROUTINE W REFLEX MICROSCOPIC
Bilirubin Urine: NEGATIVE
Glucose, UA: NEGATIVE mg/dL
Ketones, ur: 80 mg/dL — AB
Leukocytes,Ua: NEGATIVE
Nitrite: NEGATIVE
Protein, ur: 100 mg/dL — AB
Specific Gravity, Urine: 1.023 (ref 1.005–1.030)
pH: 6 (ref 5.0–8.0)

## 2019-06-05 LAB — GLUCOSE, CAPILLARY: Glucose-Capillary: 73 mg/dL (ref 70–99)

## 2019-06-05 LAB — BASIC METABOLIC PANEL
Anion gap: 14 (ref 5–15)
BUN: 6 mg/dL (ref 6–20)
CO2: 25 mmol/L (ref 22–32)
Calcium: 7.8 mg/dL — ABNORMAL LOW (ref 8.9–10.3)
Chloride: 103 mmol/L (ref 98–111)
Creatinine, Ser: 0.74 mg/dL (ref 0.44–1.00)
GFR calc Af Amer: >60 mL/min (ref >60)
GFR calc non Af Amer: >60 mL/min (ref >60)
Glucose, Bld: 97 mg/dL (ref 70–99)
Potassium: 3 mmol/L — ABNORMAL LOW (ref 3.5–5.1)
Sodium: 142 mmol/L (ref 135–145)

## 2019-06-05 LAB — PREALBUMIN: Prealbumin: 5.2 mg/dL — ABNORMAL LOW (ref 18–38)

## 2019-06-05 LAB — MAGNESIUM: Magnesium: 1.9 mg/dL (ref 1.7–2.4)

## 2019-06-05 MED ORDER — POTASSIUM CHLORIDE 10 MEQ/100ML IV SOLN
10.0000 meq | Freq: Once | INTRAVENOUS | Status: AC
  Administered 2019-06-05: 10 meq via INTRAVENOUS
  Filled 2019-06-05: qty 100

## 2019-06-05 MED ORDER — MAGNESIUM SULFATE 2 GM/50ML IV SOLN
2.0000 g | Freq: Once | INTRAVENOUS | Status: AC
  Administered 2019-06-05: 2 g via INTRAVENOUS
  Filled 2019-06-05: qty 50

## 2019-06-05 MED ORDER — SODIUM CHLORIDE 0.9% FLUSH
10.0000 mL | INTRAVENOUS | Status: DC | PRN
  Administered 2019-06-07 – 2019-06-11 (×2): 10 mL

## 2019-06-05 MED ORDER — CHLORHEXIDINE GLUCONATE CLOTH 2 % EX PADS
6.0000 | MEDICATED_PAD | Freq: Every day | CUTANEOUS | Status: DC
  Administered 2019-06-05 – 2019-06-11 (×7): 6 via TOPICAL

## 2019-06-05 MED ORDER — INSULIN ASPART 100 UNIT/ML ~~LOC~~ SOLN
0.0000 [IU] | Freq: Four times a day (QID) | SUBCUTANEOUS | Status: DC
  Administered 2019-06-06 – 2019-06-07 (×3): 1 [IU] via SUBCUTANEOUS

## 2019-06-05 MED ORDER — POTASSIUM CHLORIDE 10 MEQ/100ML IV SOLN
10.0000 meq | INTRAVENOUS | Status: AC
  Administered 2019-06-05 (×3): 10 meq via INTRAVENOUS
  Filled 2019-06-05 (×2): qty 100

## 2019-06-05 MED ORDER — TRAVASOL 10 % IV SOLN
INTRAVENOUS | Status: AC
  Filled 2019-06-05: qty 411.6

## 2019-06-05 NOTE — Progress Notes (Signed)
PHARMACY - TOTAL PARENTERAL NUTRITION CONSULT NOTE   Indication: malnutrition/prolonged intolerance to PO intake  Patient Measurements: Height: 5\' 6"  (167.6 cm) Weight: 130 lb (59 kg) IBW/kg (Calculated) : 59.3 TPN AdjBW (KG): 59 Body mass index is 20.98 kg/m.  Assessment: 43 yo f who presented for robotic assisted laparoscopic hysterectomy and BL salpingectomy for fibroids on 12/11. No issues with surgery but presented to ED on 12/18 with worsening nausea, abdominal pain and fever. ED ET scan showed complex fluid and gas collections throughout pelvis and abdomen.   Glucose / Insulin: CBGs < 100s on am labs, no insulin Electrolytes: K 3, Phos 3.3, Mg 1.9 Renal: SCr 0.74 LFTs / TGs: TBili 1.6 on 12/19 Prealbumin / albumin: Prealb 5.2 Intake / Output; MIVF: 450 mL out from NGT, No UOP documented, 45 mL from drains GI Imaging: 12/21 CT abd - abscess anterior to rectum w increasing size, smaller abscesses scattered in pelvis. Also SBO vs ileus  Surgeries / Procedures: 12/11 Hysterectomy, 12/21 IR drain placement  Central access: PICC to be placed 12/22 TPN start date: 12/22  Nutritional Goals (per RD recommendation pending): kCal: 2000, Protein: 88 g , Fluid: 1800 mL/day Goal TPN rate is 75 mL/hr (provides 88  g of protein and 2050 kcals per day)   Current Nutrition:  NPO  Plan:  Start TPN at 35 mL/hr at 1800 Electrolytes in TPN: 37mEq/L of Na, 33mEq/L of K, 79mEq/L of Ca, 50mEq/L of Mg, and 73mmol/L of Phos. Cl:Ac ratio 1:1 Add standard MVI MWF and trace elements to TPN Initiate Sensitive q6h SSI and adjust as needed  Additional 3 runs of K Monitor TPN labs on Mon/Thurs  Wynell Balloon 06/05/2019,8:55 AM

## 2019-06-05 NOTE — Progress Notes (Signed)
Referring Physician(s): Dr Lexine Baton  Supervising Physician: Aletta Edouard  Patient Status:  Deborah Murphy  Chief Complaint:  Pelvic abscesses Ileus vs pSBO Bacteremia s/pXI ROBOTIC ASSISTED LAPAROSCOPIC HYSTERECTOMY AND SALPINGECTOMY (Bilateral),CYSTOSCOPYfor fibroids on 05/25/2019 by Dr. Philis Pique    Subjective:  - s/p IR drain placement for largest pelvic abscess 12/21  OP bloody 45 ml yesterday 10 ml in JP now  Pt up in chair abd still feels "tight" Maybe less painful today  Allergies: Patient has no known allergies.  Medications: Prior to Admission medications   Medication Sig Start Date End Date Taking? Authorizing Provider  albuterol (VENTOLIN HFA) 108 (90 Base) MCG/ACT inhaler Inhale 2 puffs into the lungs every 6 (six) hours as needed for wheezing or shortness of breath.   Yes [provider]  esomeprazole (NEXIUM) 20 MG capsule Take 20 mg by mouth every evening.   Yes [provider]  methimazole (TAPAZOLE) 5 MG tablet Take 0.5 tablets (2.5 mg total) by mouth daily. 09/18/18  Yes Shamleffer, Melanie Crazier, MD  oxyCODONE-acetaminophen (PERCOCET/ROXICET) 5-325 MG tablet Take 1-2 tablets by mouth every 4 (four) hours as needed for severe pain. Patient taking differently: Take 1 tablet by mouth 2 (two) times daily as needed for severe pain.  05/25/19  Yes Bobbye Charleston, MD  triamcinolone ointment (KENALOG) 0.1 % Apply 1 application topically daily as needed for irritation. 02/06/19  Yes [provider]  benzonatate (TESSALON) 200 MG capsule Take 1 capsule (200 mg total) by mouth 3 (three) times daily as needed for cough. Patient not taking: Reported on 06/01/2019 01/23/19   Collene Gobble, MD     Vital Signs: BP 133/76 (BP Location: Left Arm)   Pulse (!) 108   Temp 99.1 F (37.3 C) (Oral)   Resp 18   Ht 5\' 6"  (1.676 m)   Wt 130 lb (59 kg)   LMP  (LMP Unknown)   SpO2 100%   BMI 20.98 kg/m   Physical Exam Vitals  reviewed.  Skin:    General: Skin is warm and dry.     Comments: Site is clean and dry NT no bleeding OP bloody 45 ml yesterday 10 ml in JP now  Special Requests HEMATOMA DRAIN  Gram Stain FEW WBC PRESENT, PREDOMINANTLY PMN  FEW GRAM NEGATIVE RODS    Neurological:     Mental Status: She is alert.     Imaging: CT ABDOMEN PELVIS W CONTRAST  Result Date: 06/04/2019 CLINICAL DATA:  Pelvic abscesses. Partial bowel obstruction. EXAM: CT ABDOMEN AND PELVIS WITH CONTRAST TECHNIQUE: Multidetector CT imaging of the abdomen and pelvis was performed using the standard protocol following bolus administration of intravenous contrast. CONTRAST:  143mL OMNIPAQUE IOHEXOL 300 MG/ML  SOLN COMPARISON:  06/01/2019 FINDINGS: Lower chest: Faint reticulonodular opacity in the right lower lobe on image 1/4, probably related to atypical infectious process. Hepatobiliary: Gallbladder wall thickening is present. Dependent density in the gallbladder likely from sludge. Pancreas: Unremarkable Spleen: Unremarkable Adrenals/Urinary Tract: Unremarkable Stomach/Bowel: The orally administered contrast is only in dilated proximal loops of small bowel demonstrates progressive dilution in dilated loops of distal jejunum site of obstruction difficult to be certain of but probably in the vicinity of an angulated loop in the mid abdomen in the small bowel. There is a network of pelvic abscesses difficult to separate from small bowel loops. A representative abscess anterior to the rectum measures 5.2 by 4.5 by 3.9 cm (volume = 48 cm^3), previously 3.6 by 2.4 by 3.1 cm (  volume = 14 cm^3). Other smaller abscesses and collections of free fluid are present in the pelvis, with fluid and a small amount of gas tracking along the pelvic sidewalls and right groin, and with free air along the right hepatic lobe margin although reduced in size compared to previous. In addition, the amount of subcutaneous edema tracking along the groin, bilateral  abdomen, and in the deep tissues the left breast is reduced compared to prior. Vascular/Lymphatic: Unremarkable Reproductive: Prior hysterectomy. Other: Stranding in the mesentery. Musculoskeletal: Unremarkable IMPRESSION: 1. The dominant abscess anterior to the rectum has increased in size compared to the prior exam, currently 48 cubic cm and previously 14 cubic cm. Other smaller abscesses are scattered in the pelvis. 2. Dilated loops of proximal small bowel extending down to a loop of distal jejunum or proximal ileum which takes an angulated course in the mid abdomen at the level of the iliac crests, and then courses inferiorly into the reticular network of pelvic abscesses. Possibilities include adhesion causing partial small bowel obstruction, versus ileus related to the abscesses. 3. Reduced volume of free intraperitoneal gas (but not resolved). Reduced volume of subcutaneous edema in the groin, bilateral abdomen, and pelvis. 4. Faint reticulonodular opacity in the right lower lobe, probably related to atypical infectious process. 5. Gallbladder wall thickening is nonspecific and could be from inflammation, hypoproteinemia, contraction, or hypoproteinemia. Electronically Signed   By: Van Clines M.D.   On: 06/04/2019 12:31   CT IMAGE GUIDED DRAINAGE BY PERCUTANEOUS CATHETER  Result Date: 06/05/2019 INDICATION: 43 year old female status post uncomplicated robotic assisted hysterectomy. Her postoperative course was subsequently complicated by development of multiple intra-abdominal fluid collections. It is uncertain if these represent areas of hematoma or abscess. The largest collection in the pelvic cul-de-sac has enlarged over serial imaging and she now presents for transgluteal drainage. EXAM: CT-guided drain placement MEDICATIONS: The patient is currently admitted to the hospital and receiving intravenous antibiotics. The antibiotics were administered within an appropriate time frame prior to the  initiation of the procedure. ANESTHESIA/SEDATION: Fentanyl 100 mcg IV; Versed 2 mg IV Moderate Sedation Time:  10 minutes The patient was continuously monitored during the procedure by the interventional radiology nurse under my direct supervision. COMPLICATIONS: None immediate. PROCEDURE: Informed written consent was obtained from the patient after a thorough discussion of the procedural risks, benefits and alternatives. All questions were addressed. Maximal Sterile Barrier Technique was utilized including caps, mask, sterile gowns, sterile gloves, sterile drape, hand hygiene and skin antiseptic. A timeout was performed prior to the initiation of the procedure. A planning axial CT scan was performed. The complex fluid collection in the pelvic cul-de-sac was successfully identified. A suitable skin entry site from a right transgluteal approach was selected and marked. The overlying skin was sterilely prepped and draped in the standard fashion using chlorhexidine skin prep. Local anesthesia was attained by infiltration with 1% lidocaine. A small dermatotomy was made. Under intermittent CT guidance, an 18 gauge trocar needle was advanced along a transgluteal, parasacral course into the fluid collection. A 0.035 wire was then coiled in the fluid collection. The needle was removed. The skin tract was dilated to 12 [place]. A Cook 12 [place] all-purpose drainage catheter was then advanced over the wire and formed. Aspiration yields 15-20 mL of dark bloody fluid. No frank purulence. Samples were sent for Gram stain and culture. The drain was gently flushed with saline and connected to JP bulb suction. The drain was then secured to the skin with 0 Prolene  suture. Follow-up axial CT imaging demonstrates a well-positioned drainage catheter and no evidence of immediate complication. IMPRESSION: 1. Successful placement of a 56 French percutaneous drain via a right transgluteal approach. Aspiration yields a relatively small  volume of dark bloody fluid. This appears to represent a liquified hematoma. Although no frank purulence was encountered, a sample of the aspirated blood was sent for Gram stain and culture. PLAN: Maintain drain to JP bulb suction. Flush drain with 10 cc saline once per shift. When drain output is scant (no more than 5-10 mL over the volume of flushes) for 48 hours or longer the drainage catheter can be removed. Signed, Criselda Peaches, MD, [facility] Electronically Signed   By: Jacqulynn Cadet M.D.   On: 06/05/2019 08:26   [place] EKG SITE RITE  Result Date: 06/05/2019 If Site Rite image not attached, placement could not be confirmed due to current cardiac rhythm.   Labs:  CBC: Recent Labs    05/23/19 1005 06/01/19 0228 06/02/19 0401 06/05/19 0128  WBC 5.0 15.3* 12.2* 10.1  HGB 12.0 11.3* 9.2* 7.8*  HCT 37.5 35.1* 28.7* 24.5*  PLT 207 290 300 285    COAGS: Recent Labs    06/01/19 0243  INR 1.1  APTT 35    BMP: Recent Labs    08/18/18 0819 06/01/19 0228 06/02/19 0401 06/05/19 0128  NA 136 137 142 142  K 4.3 3.6 3.7 3.0*  CL 104 101 106 103  CO2 26 24 24 25   GLUCOSE 85 139* 98 97  BUN 13 11 8 6   CALCIUM 8.7 8.9 8.0* 7.8*  CREATININE 0.80 0.77 0.71 0.74  GFRNONAA  --  >60 >60 >60  GFRAA  --  >60 >60 >60    LIVER FUNCTION TESTS: Recent Labs    08/18/18 0819 06/01/19 0228 06/02/19 0401  BILITOT 0.7 2.7* 1.6*  AST 20 14* 13*  ALT 24 14 11   ALKPHOS 49 81 82  PROT 7.2 8.1 6.0*  ALBUMIN 4.5 3.9 2.6*    Assessment and Plan:  Pelvic abscess drain placed yesterday in IR Intact OP bloody Cx pending Will follow  Plan per [facility]  Electronically Signed: Lavonia Drafts, PA-C 06/05/2019, 10:52 AM   I spent a total of 15 Minutes at the the patient's bedside AND on the patient's hospital floor or unit, greater than 50% of which was counseling/coordinating care for TG pelvic abscess  drain

## 2019-06-05 NOTE — Progress Notes (Signed)
Peripherally Inserted Central Catheter/Midline Placement  The IV Nurse has discussed with the patient and/or persons authorized to consent for the patient, the purpose of this procedure and the potential benefits and risks involved with this procedure.  The benefits include less needle sticks, lab draws from the catheter, and the patient may be discharged home with the catheter. Risks include, but not limited to, infection, bleeding, blood clot (thrombus formation), and puncture of an artery; nerve damage and irregular heartbeat and possibility to perform a PICC exchange if needed/ordered by physician.  Alternatives to this procedure were also discussed.  Bard Power PICC patient education guide, fact sheet on infection prevention and patient information card has been provided to patient /or left at bedside.    PICC/Midline Placement Documentation  PICC Double Lumen 06/05/19 PICC Right Brachial 39 cm 1 cm (Active)  Indication for Insertion or Continuance of Line Administration of hyperosmolar/irritating solutions (i.e. TPN, Vancomycin, etc.) 06/05/19 1500  Exposed Catheter (cm) 1 cm 06/05/19 1500  Site Assessment Clean;Dry;Intact 06/05/19 1500  Lumen #1 Status Flushed;Saline locked;Blood return noted 06/05/19 1500  Lumen #2 Status Flushed;Saline locked;Blood return noted 06/05/19 1500  Dressing Type Transparent;Securing device 06/05/19 1500  Dressing Status Clean;Dry;Intact;Antimicrobial disc in place 06/05/19 1500  Line Care Connections checked and tightened 06/05/19 1500  Dressing Intervention New dressing;Other (Comment) 06/05/19 1500  Dressing Change Due 06/12/19 06/05/19 1500       Virgilio Belling 06/05/2019, 3:41 PM

## 2019-06-05 NOTE — Progress Notes (Signed)
[facility] Progress Note     Subjective: CC-  S/p IR drain placement yesterday with 15-20cc dark blood yielded, sent for culture. WBC down 10.1, TMAX 100.6 States that she is a little sore from drain placement, but overall feeling about the same. She reports some abdominal distension. Denies n/v. No flatus. She had a few very small loose BMs yesterday. NG tube with 450cc out last 24 hours.  Objective: Vital signs in last 24 hours: Temp:  [98.6 F (37 C)-100.6 F (38.1 C)] 99.1 F (37.3 C) (12/22 0511) Pulse Rate:  [97-113] 108 (12/22 0511) Resp:  [10-18] 18 (12/22 0511) BP: (133-156)/(76-102) 133/76 (12/22 0511) SpO2:  [100 %] 100 % (12/22 0511) Last BM Date: 06/04/19  Intake/Output from previous day: 12/21 0701 - 12/22 0700 In: 0  Out: 495 [Emesis/NG output:450; Drains:45] Intake/Output this shift: No intake/output data recorded.  PE: Gen:  Alert, NAD, pleasant HEENT: EOM's intact, pupils equal and round Card:  RRR Pulm:  CTAB, no W/R/R, rate and effort normal Abd: Soft, distended, +BS, mild upper abdominal TTP without peritonitis, no HSM, no hernia, incisions cdi Ext:  Calves soft and nontender without edema Psych: A&Ox3  Skin: no rashes noted, warm and dry  Lab Results:  Recent Labs    06/05/19 0128  WBC 10.1  HGB 7.8*  HCT 24.5*  PLT 285   BMET Recent Labs    06/05/19 0128  NA 142  K 3.0*  CL 103  CO2 25  GLUCOSE 97  BUN 6  CREATININE 0.74  CALCIUM 7.8*   PT/INR No results for input(s): LABPROT, INR in the last 72 hours. CMP     Component Value Date/Time   NA 142 06/05/2019 0128   K 3.0 (L) 06/05/2019 0128   CL 103 06/05/2019 0128   CO2 25 06/05/2019 0128   GLUCOSE 97 06/05/2019 0128   BUN 6 06/05/2019 0128   CREATININE 0.74 06/05/2019 0128   CALCIUM 7.8 (L) 06/05/2019 0128   PROT 6.0 (L) 06/02/2019 0401   ALBUMIN 2.6 (L) 06/02/2019 0401   AST 13 (L) 06/02/2019 0401   ALT 11 06/02/2019 0401   ALKPHOS 82 06/02/2019 0401    BILITOT 1.6 (H) 06/02/2019 0401   GFRNONAA >60 06/05/2019 0128   GFRAA >60 06/05/2019 0128   Lipase  No results found for: LIPASE     Studies/Results: CT ABDOMEN PELVIS W CONTRAST  Result Date: 06/04/2019 CLINICAL DATA:  Pelvic abscesses. Partial bowel obstruction. EXAM: CT ABDOMEN AND PELVIS WITH CONTRAST TECHNIQUE: Multidetector CT imaging of the abdomen and pelvis was performed using the standard protocol following bolus administration of intravenous contrast. CONTRAST:  129mL OMNIPAQUE IOHEXOL 300 MG/ML  SOLN COMPARISON:  06/01/2019 FINDINGS: Lower chest: Faint reticulonodular opacity in the right lower lobe on image 1/4, probably related to atypical infectious process. Hepatobiliary: Gallbladder wall thickening is present. Dependent density in the gallbladder likely from sludge. Pancreas: Unremarkable Spleen: Unremarkable Adrenals/Urinary Tract: Unremarkable Stomach/Bowel: The orally administered contrast is only in dilated proximal loops of small bowel demonstrates progressive dilution in dilated loops of distal jejunum site of obstruction difficult to be certain of but probably in the vicinity of an angulated loop in the mid abdomen in the small bowel. There is a network of pelvic abscesses difficult to separate from small bowel loops. A representative abscess anterior to the rectum measures 5.2 by 4.5 by 3.9 cm (volume = 48 cm^3), previously 3.6 by 2.4 by 3.1 cm (volume = 14 cm^3). Other smaller abscesses and collections  of free fluid are present in the pelvis, with fluid and a small amount of gas tracking along the pelvic sidewalls and right groin, and with free air along the right hepatic lobe margin although reduced in size compared to previous. In addition, the amount of subcutaneous edema tracking along the groin, bilateral abdomen, and in the deep tissues the left breast is reduced compared to prior. Vascular/Lymphatic: Unremarkable Reproductive: Prior hysterectomy. Other: Stranding in  the mesentery. Musculoskeletal: Unremarkable IMPRESSION: 1. The dominant abscess anterior to the rectum has increased in size compared to the prior exam, currently 48 cubic cm and previously 14 cubic cm. Other smaller abscesses are scattered in the pelvis. 2. Dilated loops of proximal small bowel extending down to a loop of distal jejunum or proximal ileum which takes an angulated course in the mid abdomen at the level of the iliac crests, and then courses inferiorly into the reticular network of pelvic abscesses. Possibilities include adhesion causing partial small bowel obstruction, versus ileus related to the abscesses. 3. Reduced volume of free intraperitoneal gas (but not resolved). Reduced volume of subcutaneous edema in the groin, bilateral abdomen, and pelvis. 4. Faint reticulonodular opacity in the right lower lobe, probably related to atypical infectious process. 5. Gallbladder wall thickening is nonspecific and could be from inflammation, hypoproteinemia, contraction, or hypoproteinemia. Electronically Signed   By: Van Clines M.D.   On: 06/04/2019 12:31    Anti-infectives: Anti-infectives (From admission, onward)   Start     Dose/Rate Route Frequency Ordered Stop   06/01/19 0600  piperacillin-tazobactam (ZOSYN) IVPB 3.375 g     3.375 g 12.5 mL/hr over 240 Minutes Intravenous Every 8 hours 06/01/19 0521     06/01/19 0245  ceFEPIme (MAXIPIME) 2 g in sodium chloride 0.9 % 100 mL IVPB     2 g 200 mL/hr over 30 Minutes Intravenous  Once 06/01/19 0244 06/01/19 0451   06/01/19 0245  metroNIDAZOLE (FLAGYL) IVPB 500 mg     500 mg 100 mL/hr over 60 Minutes Intravenous  Once 06/01/19 0244 06/01/19 0451       Assessment/Plan Graves disease  Pelvic abscesses Ileus vs pSBO Bacteremia s/p XI ROBOTIC ASSISTED LAPAROSCOPIC HYSTERECTOMY AND SALPINGECTOMY (Bilateral), CYSTOSCOPY for fibroids on 05/25/2019 by Dr. Philis Pique - s/p IR drain placement for largest pelvic abscess 12/21, culture  pending - she remains distended and is passing no flatus, more likely ileus than SBO - Keep K>4 and Mg>2 for bowel function  FEN - IVF, NPO/NGT ID - zosyn 12/18>> VTE - SCDs, okay for chemical prophylaxis from a general surgery standpoint Foley - none Follow up - Dr. Philis Pique  Plan: Continue NPO/NGT to LIWS. Mobilize. Continue IV zosyn, IR drain, and follow cultures. K and Mag replaced by primary team. Will order PICC/TNA due to malnutrition (prealbumin 5.2) and prolonged intolerance to PO intake.   LOS: 4 days    [facility] 06/05/2019, 8:24 AM Please see Amion for pager number during day hours 7:00am-4:30pm

## 2019-06-05 NOTE — Progress Notes (Signed)
Patient is ambulating and voiding.  Pain control is good.   Had multiple episodes of diarrhea over last several days with no blood noted.  NG still in place.  Vitals:   06/04/19 1925 06/04/19 1940 06/04/19 2002 06/05/19 0511  BP: (!) 148/97 (!) 144/91 (!) 148/77 133/76  Pulse: (!) 113 (!) 102 99 (!) 108  Resp: 12 10 16 18   Temp:   (!) 100.6 F (38.1 C) 99.1 F (37.3 C)  TempSrc:   Oral Oral  SpO2: 100% 100% 100% 100%  Weight:      Height:        lungs:   clear to auscultation cor:    RRR Abdomen:  Soft but with some bloating, appropriate tenderness, incisions intact and without erythema or exudate.  Bowel sounds present. ex:    no cords   Results for orders placed or performed during the hospital encounter of 06/01/19 (from the past 24 hour(s))  Aerobic/Anaerobic Culture (surgical/deep wound)     Status: None (Preliminary result)   Collection Time: 06/04/19  7:44 PM   Specimen: Wound; Blood  Result Value Ref Range   Specimen Description WOUND PELVIS    Special Requests HEMATOMA DRAIN    Gram Stain      FEW WBC PRESENT, PREDOMINANTLY PMN FEW GRAM NEGATIVE RODS Performed at [facility], [address], [place], [place] [address]    Culture PENDING    Report Status PENDING   CBC     Status: Abnormal   Collection Time: 06/05/19  1:28 AM  Result Value Ref Range   WBC 10.1 4.0 - 10.5 K/uL   RBC 2.54 (L) 3.87 - 5.11 MIL/uL   Hemoglobin 7.8 (L) 12.0 - 15.0 g/dL   HCT 24.5 (L) 36.0 - 46.0 %   MCV 96.5 80.0 - 100.0 fL   MCH 30.7 26.0 - 34.0 pg   MCHC 31.8 30.0 - 36.0 g/dL   RDW 12.4 11.5 - 15.5 %   Platelets 285 150 - 400 K/uL   nRBC 0.0 0.0 - 0.2 %  Basic metabolic panel     Status: Abnormal   Collection Time: 06/05/19  1:28 AM  Result Value Ref Range   Sodium 142 135 - 145 mmol/L   Potassium 3.0 (L) 3.5 - 5.1 mmol/L   Chloride 103 98 - 111 mmol/L   CO2 25 22 - 32 mmol/L   Glucose, Bld 97 70 - 99 mg/dL   BUN 6 6 - 20 mg/dL   Creatinine, Ser 0.74 0.44 - 1.00  mg/dL   Calcium 7.8 (L) 8.9 - 10.3 mg/dL   GFR calc non Af Amer >60 >60 mL/min   GFR calc Af Amer >60 >60 mL/min   Anion gap 14 5 - 15  Magnesium     Status: None   Collection Time: 06/05/19  1:28 AM  Result Value Ref Range   Magnesium 1.9 1.7 - 2.4 mg/dL  Phosphorus     Status: None   Collection Time: 06/05/19  1:28 AM  Result Value Ref Range   Phosphorus 3.3 2.5 - 4.6 mg/dL  Prealbumin     Status: Abnormal   Collection Time: 06/05/19  1:28 AM  Result Value Ref Range   Prealbumin 5.2 (L) 18 - 38 mg/dL   NG 24 hour output 450 (pt also drank contrast yesterday.) Drain 24 hour output 45.  A: HD# 3,  POD #11 Robotic TLH/salpingectomies with drainage now of old blood from cul  [place] sac sent for  culture and multiple areas in the abdomen that may also be infected blood collections.  Partial small bowel obstruction. 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  P:  1.  Continue Zosyn for now.  Mild fever last night was directly post procedure from percutaneous drainage and I expect the fever not to return.   2.  S/p drainage of largest abscess by IR- appreciate their help!.  Fluid sent for culture.  Will d/c drain when 48 hour output is <5cc over flush volume. 3.  Per surgery, will replete K+ and Mg+ as both are slightly low. 4.  Will set up PICC line today.   5.  Anemia.  H/H dropped again to 7.8.  Will d/w pt possible blood transfusion to help aid in recovery but not absolutely necessary.  If declines blood transfusion, will do IV iron.     6.  On methimazole for Graves, protonix IV for GERD.     Daria Pastures

## 2019-06-06 ENCOUNTER — Inpatient Hospital Stay (HOSPITAL_COMMUNITY): Payer: BC Managed Care – PPO

## 2019-06-06 LAB — GLUCOSE, CAPILLARY
Glucose-Capillary: 112 mg/dL — ABNORMAL HIGH (ref 70–99)
Glucose-Capillary: 119 mg/dL — ABNORMAL HIGH (ref 70–99)
Glucose-Capillary: 125 mg/dL — ABNORMAL HIGH (ref 70–99)
Glucose-Capillary: 126 mg/dL — ABNORMAL HIGH (ref 70–99)

## 2019-06-06 LAB — PHOSPHORUS: Phosphorus: 2.7 mg/dL (ref 2.5–4.6)

## 2019-06-06 LAB — COMPREHENSIVE METABOLIC PANEL
ALT: 22 U/L (ref 0–44)
AST: 42 U/L — ABNORMAL HIGH (ref 15–41)
Albumin: 2.5 g/dL — ABNORMAL LOW (ref 3.5–5.0)
Alkaline Phosphatase: 59 U/L (ref 38–126)
Anion gap: 12 (ref 5–15)
BUN: <5 mg/dL — ABNORMAL LOW (ref 6–20)
CO2: 26 mmol/L (ref 22–32)
Calcium: 7.8 mg/dL — ABNORMAL LOW (ref 8.9–10.3)
Chloride: 104 mmol/L (ref 98–111)
Creatinine, Ser: 0.49 mg/dL (ref 0.44–1.00)
GFR calc Af Amer: >60 mL/min (ref >60)
GFR calc non Af Amer: >60 mL/min (ref >60)
Glucose, Bld: 140 mg/dL — ABNORMAL HIGH (ref 70–99)
Potassium: 3.6 mmol/L (ref 3.5–5.1)
Sodium: 142 mmol/L (ref 135–145)
Total Bilirubin: 1.2 mg/dL (ref 0.3–1.2)
Total Protein: 5.6 g/dL — ABNORMAL LOW (ref 6.5–8.1)

## 2019-06-06 LAB — DIFFERENTIAL
Abs Immature Granulocytes: 0 10*3/uL (ref 0.00–0.07)
Basophils Absolute: 0 10*3/uL (ref 0.0–0.1)
Basophils Relative: 0 %
Eosinophils Absolute: 0 10*3/uL (ref 0.0–0.5)
Eosinophils Relative: 0 %
Lymphocytes Relative: 4 %
Lymphs Abs: 0.4 10*3/uL — ABNORMAL LOW (ref 0.7–4.0)
Monocytes Absolute: 0.3 10*3/uL (ref 0.1–1.0)
Monocytes Relative: 3 %
Neutro Abs: 9.4 10*3/uL — ABNORMAL HIGH (ref 1.7–7.7)
Neutrophils Relative %: 93 %
nRBC: 1 /100 WBC — ABNORMAL HIGH

## 2019-06-06 LAB — CBC
HCT: 25.7 % — ABNORMAL LOW (ref 36.0–46.0)
Hemoglobin: 8 g/dL — ABNORMAL LOW (ref 12.0–15.0)
MCH: 30.3 pg (ref 26.0–34.0)
MCHC: 31.1 g/dL (ref 30.0–36.0)
MCV: 97.3 fL (ref 80.0–100.0)
Platelets: 364 10*3/uL (ref 150–400)
RBC: 2.64 MIL/uL — ABNORMAL LOW (ref 3.87–5.11)
RDW: 12.3 % (ref 11.5–15.5)
WBC: 10.1 10*3/uL (ref 4.0–10.5)
nRBC: 0.2 % (ref 0.0–0.2)

## 2019-06-06 LAB — CULTURE, BLOOD (ROUTINE X 2): Culture: NO GROWTH

## 2019-06-06 LAB — TRIGLYCERIDES: Triglycerides: 123 mg/dL (ref <150)

## 2019-06-06 LAB — PREALBUMIN: Prealbumin: 5.9 mg/dL — ABNORMAL LOW (ref 18–38)

## 2019-06-06 LAB — MAGNESIUM: Magnesium: 2.2 mg/dL (ref 1.7–2.4)

## 2019-06-06 MED ORDER — TRAVASOL 10 % IV SOLN
INTRAVENOUS | Status: AC
  Filled 2019-06-06: qty 642

## 2019-06-06 MED ORDER — ENOXAPARIN SODIUM 40 MG/0.4ML ~~LOC~~ SOLN
40.0000 mg | SUBCUTANEOUS | Status: DC
  Administered 2019-06-06 – 2019-06-11 (×6): 40 mg via SUBCUTANEOUS
  Filled 2019-06-06 (×6): qty 0.4

## 2019-06-06 MED ORDER — SODIUM CHLORIDE 0.9 % IV SOLN
3.0000 g | Freq: Four times a day (QID) | INTRAVENOUS | Status: DC
  Administered 2019-06-06 – 2019-06-11 (×20): 3 g via INTRAVENOUS
  Filled 2019-06-06 (×9): qty 3
  Filled 2019-06-06: qty 8
  Filled 2019-06-06 (×2): qty 3
  Filled 2019-06-06: qty 8
  Filled 2019-06-06 (×2): qty 3
  Filled 2019-06-06: qty 8
  Filled 2019-06-06 (×5): qty 3
  Filled 2019-06-06: qty 8
  Filled 2019-06-06 (×2): qty 3

## 2019-06-06 MED ORDER — POTASSIUM PHOSPHATES 15 MMOLE/5ML IV SOLN
10.0000 mmol | Freq: Once | INTRAVENOUS | Status: AC
  Administered 2019-06-06: 10 mmol via INTRAVENOUS
  Filled 2019-06-06: qty 3.33

## 2019-06-06 NOTE — Progress Notes (Signed)
Initial Nutrition Assessment  DOCUMENTATION CODES:   Not applicable  INTERVENTION:   -Continue prenatal vitamin -TPN management per pharmacy -RD will follow for diet advancement and add supplements as appropriate  NUTRITION DIAGNOSIS:   Inadequate oral intake related to altered GI function as evidenced by NPO status.  GOAL:   Patient will meet greater than or equal to 90% of their needs  MONITOR:   PO intake, Supplement acceptance, Diet advancement, Labs, Weight trends, Skin, I & O's  REASON FOR ASSESSMENT:   Consult New TPN/TNA  ASSESSMENT:   43 yo S/P robotic hysterectomy on 12/11 presents to ED c/o fever and worsening nausea. Patient developed a fever on the afternoon of 12/17. Initially this was improved by tylenol but throughout the evening symptoms became worse and she presented to [facility] ED for evaluation.  Pt admitted with multiple intraabdominal abscesses s/p robotic TLH/salpingectomies (05/25/19).   12/18- NGT placed 12/21- CT revealed partial SBO, s/p drain placement by IR 12/22- PICC placed, TPN initiated  Reviewed I/O's: +1.7 L x 24 hours and +3.3 L since admission  Drain output: 5 ml x 24 hours  Spoke with pt at bedside, who is pleasant and in good spirits today. She denies any nausea, vomiting, and abdominal pain. She feels encouraged that there is little output via NGT. Noted x-ray taken just prior to RD visit; per MD notes, x-ray results will determine further plan of care.   Pt reports that she has had decreased intake since surgery on 05/24/09. During that time, pt has mainly been consuming liquids, soups, and crackers. Intake gradually increased to where she was unable to consume solid foods for about a week, but able to continue to consume liquids.   Pt shares her UBW is around 130-135#. She has always been small framed and active; per her report, she recently moved from a small apartment to a house prior to her surgery. She thinks she has lost weight,  but is unsure of the amount. Noted pt has experienced a 4.3% wt loss over the past 6 months, which is not significant for time frame.   Pt currently receiving TPN at 35 ml/hr, which provides 797 kcals and 41 grams of protein, meeting 46% of estimated kcal needs and 46% of estimated protein needs. Per pharmacy notes, plan to increase TPN to 50 ml/hr, which provides 1241 kcals and 64 grams protein, meeting 71% of estimated kcal needs and 71% of estimated protein needs.   Medications revidewed and include lactated rinfers infusion @ 100 ml/hr.   Labs reviewed: CBGS: 125 (inpatient orders for glycemic control are 0-9 units insulin aspaert every 6 hours).   NUTRITION - FOCUSED PHYSICAL EXAM:    Most Recent Value  Orbital Region  No depletion  Upper Arm Region  No depletion  Thoracic and Lumbar Region  No depletion  Buccal Region  No depletion  Temple Region  No depletion  Clavicle Bone Region  No depletion  Clavicle and Acromion Bone Region  No depletion  Scapular Bone Region  No depletion  Dorsal Hand  No depletion  Patellar Region  No depletion  Anterior Thigh Region  No depletion  Posterior Calf Region  No depletion  Edema (RD Assessment)  None  Hair  Reviewed  Eyes  Reviewed  Mouth  Reviewed  Skin  Reviewed  Nails  Reviewed      Diet Order:   Diet Order            Diet clear liquid Room service appropriate?  Yes; Fluid consistency: Thin  Diet effective now              EDUCATION NEEDS:   Education needs have been addressed  Skin:  Skin Assessment: Skin Integrity Issues: Skin Integrity Issues:: Incisions Incisions: closed vaginal  Last BM:  06/06/19  Height:   Ht Readings from Last 1 Encounters:  06/01/19 5\' 6"  (1.676 m)    Weight:   Wt Readings from Last 1 Encounters:  06/01/19 59 kg    Ideal Body Weight:  59.1 kg  BMI:  Body mass index is 20.98 kg/m.  Estimated Nutritional Needs:   Kcal:  [id number]  Protein:  90-105 grams  Fluid:  > 1.7  L    Tonna Palazzi A. Jimmye Norman, RD, LDN, [place] Registered Dietitian II Certified Diabetes Care and Education Specialist Pager: [phone] After hours Pager: [phone]

## 2019-06-06 NOTE — Progress Notes (Signed)
Patient is ambulating and voiding.  Pain control is good.  Had more diarrhea yesterday and last night but some more particulate matter.  Pt was worried last night that NG tube output was much less - same today, but NG seems to be in place.  Xray today should confirm that today.  Pt also having some bright red blood from vagina but just staining pad.    Vitals:   06/05/19 0511 06/05/19 1516 06/05/19 2111 06/06/19 0641  BP: 133/76 133/84 (!) 145/95 (!) 144/94  Pulse: (!) 108 80 96 (!) 104  Resp: 18 (!) 8 18 16   Temp: 99.1 F (37.3 C) 97.9 F (36.6 C) 97.9 F (36.6 C) 98 F (36.7 C)  TempSrc: Oral Oral Oral Oral  SpO2: 100% 100% 100% 100%  Weight:      Height:        lungs:   clear to auscultation cor:    RRR Abdomen:  soft, appropriate tenderness, incisions intact and without erythema or exudate. ex:    no cords  Bimanual vagina exam: Intact cuff, no masses felt but bowels feel distended.  No active bleeding or abnormal discharge.   Results for orders placed or performed during the hospital encounter of 06/01/19 (from the past 24 hour(s))  Glucose, capillary     Status: None   Collection Time: 06/05/19  5:58 PM  Result Value Ref Range   Glucose-Capillary 73 70 - 99 mg/dL  Urinalysis, Routine w reflex microscopic     Status: Abnormal   Collection Time: 06/05/19  7:32 PM  Result Value Ref Range   Color, Urine YELLOW YELLOW   APPearance HAZY (A) CLEAR   Specific Gravity, Urine 1.023 1.005 - 1.030   pH 6.0 5.0 - 8.0   Glucose, UA NEGATIVE NEGATIVE mg/dL   Hgb urine dipstick SMALL (A) NEGATIVE   Bilirubin Urine NEGATIVE NEGATIVE   Ketones, ur 80 (A) NEGATIVE mg/dL   Protein, ur 100 (A) NEGATIVE mg/dL   Nitrite NEGATIVE NEGATIVE   Leukocytes,Ua NEGATIVE NEGATIVE   RBC / HPF 6-10 0 - 5 RBC/hpf   WBC, UA 0-5 0 - 5 WBC/hpf   Bacteria, UA RARE (A) NONE SEEN   Squamous Epithelial / LPF 0-5 0 - 5   Mucus PRESENT   Glucose, capillary     Status: Abnormal   Collection Time: 06/06/19  12:08 AM  Result Value Ref Range   Glucose-Capillary 112 (H) 70 - 99 mg/dL  Magnesium     Status: None   Collection Time: 06/06/19  4:49 AM  Result Value Ref Range   Magnesium 2.2 1.7 - 2.4 mg/dL  CBC     Status: Abnormal   Collection Time: 06/06/19  4:49 AM  Result Value Ref Range   WBC 10.1 4.0 - 10.5 K/uL   RBC 2.64 (L) 3.87 - 5.11 MIL/uL   Hemoglobin 8.0 (L) 12.0 - 15.0 g/dL   HCT 25.7 (L) 36.0 - 46.0 %   MCV 97.3 80.0 - 100.0 fL   MCH 30.3 26.0 - 34.0 pg   MCHC 31.1 30.0 - 36.0 g/dL   RDW 12.3 11.5 - 15.5 %   Platelets 364 150 - 400 K/uL   nRBC 0.2 0.0 - 0.2 %  Comprehensive metabolic panel     Status: Abnormal   Collection Time: 06/06/19  4:49 AM  Result Value Ref Range   Sodium 142 135 - 145 mmol/L   Potassium 3.6 3.5 - 5.1 mmol/L   Chloride 104 98 - 111 mmol/L  CO2 26 22 - 32 mmol/L   Glucose, Bld 140 (H) 70 - 99 mg/dL   BUN <5 (L) 6 - 20 mg/dL   Creatinine, Ser 0.49 0.44 - 1.00 mg/dL   Calcium 7.8 (L) 8.9 - 10.3 mg/dL   Total Protein 5.6 (L) 6.5 - 8.1 g/dL   Albumin 2.5 (L) 3.5 - 5.0 g/dL   AST 42 (H) 15 - 41 U/L   ALT 22 0 - 44 U/L   Alkaline Phosphatase 59 38 - 126 U/L   Total Bilirubin 1.2 0.3 - 1.2 mg/dL   GFR calc non Af Amer >60 >60 mL/min   GFR calc Af Amer >60 >60 mL/min   Anion gap 12 5 - 15  Prealbumin     Status: Abnormal   Collection Time: 06/06/19  4:49 AM  Result Value Ref Range   Prealbumin 5.9 (L) 18 - 38 mg/dL  Phosphorus     Status: None   Collection Time: 06/06/19  4:49 AM  Result Value Ref Range   Phosphorus 2.7 2.5 - 4.6 mg/dL  Triglycerides     Status: None   Collection Time: 06/06/19  4:49 AM  Result Value Ref Range   Triglycerides 123 <150 mg/dL  Differential     Status: Abnormal   Collection Time: 06/06/19  4:49 AM  Result Value Ref Range   Neutrophils Relative % 93 %   Neutro Abs 9.4 (H) 1.7 - 7.7 K/uL   Lymphocytes Relative 4 %   Lymphs Abs 0.4 (L) 0.7 - 4.0 K/uL   Monocytes Relative 3 %   Monocytes Absolute 0.3 0.1 -  1.0 K/uL   Eosinophils Relative 0 %   Eosinophils Absolute 0.0 0.0 - 0.5 K/uL   Basophils Relative 0 %   Basophils Absolute 0.0 0.0 - 0.1 K/uL   nRBC 1 (H) 0 /100 WBC   Abs Immature Granulocytes 0.00 0.00 - 0.07 K/uL   Polychromasia PRESENT   Glucose, capillary     Status: Abnormal   Collection Time: 06/06/19  6:37 AM  Result Value Ref Range   Glucose-Capillary 125 (H) 70 - 99 mg/dL    A/P HD  #4, POD #12 Robotic TLH/salpingegectomies.  1. Cultrue of serous fluid with a few gram negative rods but no growth <12 hours.  Pharm believes that likely will be same bacteria as was in blood culture.  Will consider changing to narrower spectrum antibiotic.  UTI with enterococcus should have been treated already.  Pharm would like to change antibiotic to Unasyn- will do. Pt remains afebrile. 2.  S/p drainage of main fluid collection.  JP in place and drained 55cc of bloody fluid yesterday.  Continue until output is <5 ml for 48 hours.  Vaginal spotting likely from interruption of fluid collection in cul de sac.  Vagina intact and no active bleeding. No discharge like yeast either.  3.  General surgery follwowing and helping with care of patient (thank you!)  K+, Mg repleted yesterday.  Nutrition now following and started TPN.   4.  Started lovenox now all procedures are done. 5.  Pt c/o some burning with voiding yesterday.  UA showed no LE and <5 WBC.  Better today and no vaginal discharge. 6.  D/w pt blood transfusion versus iron IV yesterday.  H/H at 8 today and pt and I agreed to pursue iron instead.  Will d/w Dietitian if need to add since she is now getting TPN. 7.  Per General Surgery, Xrays of abdomen today and  will they will decide when to pull NG.

## 2019-06-06 NOTE — Progress Notes (Signed)
PHARMACY - TOTAL PARENTERAL NUTRITION CONSULT NOTE  Indication: malnutrition/prolonged intolerance to PO intake  Patient Measurements: Height: 5\' 6"  (167.6 cm) Weight: 130 lb (59 kg) IBW/kg (Calculated) : 59.3 TPN AdjBW (KG): 59 Body mass index is 20.98 kg/m.  Assessment:  2 YOF who presented for robotic assisted laparoscopic hysterectomy and BL salpingectomy for fibroids on 12/11. No issues with surgery but presented to ED on 12/18 with worsening nausea, abdominal pain and fever. ED ET scan showed complex fluid and gas collections throughout pelvis and abdomen.   Glucose / Insulin: no hx DM - CBGs controlled.  Received 1 unit SSI since TPN started. Electrolytes: K and Phos low normal, Na and Mag high normal (post Mag 2gm and 3 runs KCL), others WNL Renal: SCr 0.49, BUN < 5 LFTs / TGs: LFTs WNL except AST 42, tbili normalized, TG WNL Prealbumin: 5.9 Intake / Output; MIVF: BM x3 (diarrhea), no NG or UOP documented - urinated 5 times, drain O/P 55 mL, LR at 100 ml/hr GI Imaging: 12/21 CT abd - abscess anterior to rectum w increasing size, smaller abscesses scattered in pelvis. Also SBO vs ileus  Surgeries / Procedures: 12/11 hysterectomy, 12/21 IR drain placement  Central access: PICC placed 06/05/19 TPN start date: 06/05/19  Nutritional Goals (per RD rec pending): 1700-1850 kCal, 85-95g protein, ~1.8L fluid per day   Current Nutrition:  TPN  Plan:  Increase TPN to 50 ml/hr (goal rate ~70 ml/hr)  TPN will provide 64g AA, 180g CHO and 37g ILE for a total of 1241 kCal, meeting ~70% of patient needs Electrolytes in TPN: increase K and Phos, Cl:Ac 1:1 - lytes fluctuation with TPN rate increase Daily trace elements in TPN Continue prenatal multivitamin PO daily Continue sensitive SSI Q6H and adjust as needed  KPhos 10 mmol IV to prevent them from dropping F/U AM labs, ability to start an oral diet  Deborah Murphy, PharmD, BCPS, [place] 06/06/2019, 11:31 AM

## 2019-06-06 NOTE — Progress Notes (Signed)
Subjective: Feeling better.  Feels less distended.  Passing flatus and stool.  NG output 450 cc overnight. Abdominal x-rays not yet done. Ambulating. JP drainage serosanguineous. Afebrile.  Heart rate 104. Preliminary pelvic fluid culture growing gram-negative rods less than 12 hours--- on Unasyn  Objective: Vital signs in last 24 hours: Temp:  [97.9 F (36.6 C)-98 F (36.7 C)] 98 F (36.7 C) (12/23 0641) Pulse Rate:  [80-104] 104 (12/23 0641) Resp:  [8-18] 16 (12/23 0641) BP: (133-145)/(84-95) 144/94 (12/23 0641) SpO2:  [100 %] 100 % (12/23 0641) Last BM Date: 06/06/19  Intake/Output from previous day: 12/22 0701 - 12/23 0700 In: 1801.4 [I.V.:1250; NG/GT:40; IV Piggyback:501.4] Out: 58 [Drains:55; Stool:3] Intake/Output this shift: No intake/output data recorded.   EXAM:  General appearance: Alert.  Very pleasant and cooperative.  No obvious distress.  NG tube in place Resp: clear to auscultation bilaterally GI: Abdomen soft.  A little tender in the suprapubic area but nontender in the mid and upper abdomen.  I do not feel a mass.  Bowel sounds are active.  Wound healing nicely.  JP drain is serosanguineous.  Nonenteric.  Nonpurulent Extremities: extremities normal, atraumatic, no cyanosis or edema  Lab Results:  Recent Labs    06/05/19 0128 06/06/19 0449  WBC 10.1 10.1  HGB 7.8* 8.0*  HCT 24.5* 25.7*  PLT 285 364   BMET Recent Labs    06/05/19 0128 06/06/19 0449  NA 142 142  K 3.0* 3.6  CL 103 104  CO2 25 26  GLUCOSE 97 140*  BUN 6 <5*  CREATININE 0.74 0.49  CALCIUM 7.8* 7.8*   PT/INR No results for input(s): LABPROT, INR in the last 72 hours. ABG No results for input(s): PHART, HCO3 in the last 72 hours.  Invalid input(s): PCO2, PO2  Studies/Results: CT ABDOMEN PELVIS W CONTRAST  Result Date: 06/04/2019 CLINICAL DATA:  Pelvic abscesses. Partial bowel obstruction. EXAM: CT ABDOMEN AND PELVIS WITH CONTRAST TECHNIQUE: Multidetector CT imaging  of the abdomen and pelvis was performed using the standard protocol following bolus administration of intravenous contrast. CONTRAST:  16mL OMNIPAQUE IOHEXOL 300 MG/ML  SOLN COMPARISON:  06/01/2019 FINDINGS: Lower chest: Faint reticulonodular opacity in the right lower lobe on image 1/4, probably related to atypical infectious process. Hepatobiliary: Gallbladder wall thickening is present. Dependent density in the gallbladder likely from sludge. Pancreas: Unremarkable Spleen: Unremarkable Adrenals/Urinary Tract: Unremarkable Stomach/Bowel: The orally administered contrast is only in dilated proximal loops of small bowel demonstrates progressive dilution in dilated loops of distal jejunum site of obstruction difficult to be certain of but probably in the vicinity of an angulated loop in the mid abdomen in the small bowel. There is a network of pelvic abscesses difficult to separate from small bowel loops. A representative abscess anterior to the rectum measures 5.2 by 4.5 by 3.9 cm (volume = 48 cm^3), previously 3.6 by 2.4 by 3.1 cm (volume = 14 cm^3). Other smaller abscesses and collections of free fluid are present in the pelvis, with fluid and a small amount of gas tracking along the pelvic sidewalls and right groin, and with free air along the right hepatic lobe margin although reduced in size compared to previous. In addition, the amount of subcutaneous edema tracking along the groin, bilateral abdomen, and in the deep tissues the left breast is reduced compared to prior. Vascular/Lymphatic: Unremarkable Reproductive: Prior hysterectomy. Other: Stranding in the mesentery. Musculoskeletal: Unremarkable IMPRESSION: 1. The dominant abscess anterior to the rectum has increased in size compared to the prior  exam, currently 48 cubic cm and previously 14 cubic cm. Other smaller abscesses are scattered in the pelvis. 2. Dilated loops of proximal small bowel extending down to a loop of distal jejunum or proximal ileum  which takes an angulated course in the mid abdomen at the level of the iliac crests, and then courses inferiorly into the reticular network of pelvic abscesses. Possibilities include adhesion causing partial small bowel obstruction, versus ileus related to the abscesses. 3. Reduced volume of free intraperitoneal gas (but not resolved). Reduced volume of subcutaneous edema in the groin, bilateral abdomen, and pelvis. 4. Faint reticulonodular opacity in the right lower lobe, probably related to atypical infectious process. 5. Gallbladder wall thickening is nonspecific and could be from inflammation, hypoproteinemia, contraction, or hypoproteinemia. Electronically Signed   By: Van Clines M.D.   On: 06/04/2019 12:31   CT IMAGE GUIDED DRAINAGE BY PERCUTANEOUS CATHETER  Result Date: 06/05/2019 INDICATION: 43 year old female status post uncomplicated robotic assisted hysterectomy. Her postoperative course was subsequently complicated by development of multiple intra-abdominal fluid collections. It is uncertain if these represent areas of hematoma or abscess. The largest collection in the pelvic cul-de-sac has enlarged over serial imaging and she now presents for transgluteal drainage. EXAM: CT-guided drain placement MEDICATIONS: The patient is currently admitted to the hospital and receiving intravenous antibiotics. The antibiotics were administered within an appropriate time frame prior to the initiation of the procedure. ANESTHESIA/SEDATION: Fentanyl 100 mcg IV; Versed 2 mg IV Moderate Sedation Time:  10 minutes The patient was continuously monitored during the procedure by the interventional radiology nurse under my direct supervision. COMPLICATIONS: None immediate. PROCEDURE: Informed written consent was obtained from the patient after a thorough discussion of the procedural risks, benefits and alternatives. All questions were addressed. Maximal Sterile Barrier Technique was utilized including caps, mask,  sterile gowns, sterile gloves, sterile drape, hand hygiene and skin antiseptic. A timeout was performed prior to the initiation of the procedure. A planning axial CT scan was performed. The complex fluid collection in the pelvic cul-de-sac was successfully identified. A suitable skin entry site from a right transgluteal approach was selected and marked. The overlying skin was sterilely prepped and draped in the standard fashion using chlorhexidine skin prep. Local anesthesia was attained by infiltration with 1% lidocaine. A small dermatotomy was made. Under intermittent CT guidance, an 18 gauge trocar needle was advanced along a transgluteal, parasacral course into the fluid collection. A 0.035 wire was then coiled in the fluid collection. The needle was removed. The skin tract was dilated to 12 [place]. A Cook 12 [place] all-purpose drainage catheter was then advanced over the wire and formed. Aspiration yields 15-20 mL of dark bloody fluid. No frank purulence. Samples were sent for Gram stain and culture. The drain was gently flushed with saline and connected to JP bulb suction. The drain was then secured to the skin with 0 Prolene suture. Follow-up axial CT imaging demonstrates a well-positioned drainage catheter and no evidence of immediate complication. IMPRESSION: 1. Successful placement of a 84 French percutaneous drain via a right transgluteal approach. Aspiration yields a relatively small volume of dark bloody fluid. This appears to represent a liquified hematoma. Although no frank purulence was encountered, a sample of the aspirated blood was sent for Gram stain and culture. PLAN: Maintain drain to JP bulb suction. Flush drain with 10 cc saline once per shift. When drain output is scant (no more than 5-10 mL over the volume of flushes) for 48 hours or longer the drainage  catheter can be removed. Signed, Criselda Peaches, MD, [facility]  Electronically Signed   By: Jacqulynn Cadet M.D.   On: 06/05/2019 08:26   [place] EKG SITE RITE  Result Date: 06/05/2019 If Site Rite image not attached, placement could not be confirmed due to current cardiac rhythm.   Anti-infectives: Anti-infectives (From admission, onward)   Start     Dose/Rate Route Frequency Ordered Stop   06/06/19 1600  Ampicillin-Sulbactam (UNASYN) 3 g in sodium chloride 0.9 % 100 mL IVPB     3 g 200 mL/hr over 30 Minutes Intravenous Every 6 hours 06/06/19 0836     06/01/19 0600  piperacillin-tazobactam (ZOSYN) IVPB 3.375 g  Status:  Discontinued     3.375 g 12.5 mL/hr over 240 Minutes Intravenous Every 8 hours 06/01/19 0521 06/06/19 0834   06/01/19 0245  ceFEPIme (MAXIPIME) 2 g in sodium chloride 0.9 % 100 mL IVPB     2 g 200 mL/hr over 30 Minutes Intravenous  Once 06/01/19 0244 06/01/19 0451   06/01/19 0245  metroNIDAZOLE (FLAGYL) IVPB 500 mg     500 mg 100 mL/hr over 60 Minutes Intravenous  Once 06/01/19 0244 06/01/19 0451      Assessment/Plan:  Graves disease  Pelvic abscesses Ileus vs pSBO Bacteremia s/pXI ROBOTIC ASSISTED LAPAROSCOPIC HYSTERECTOMY AND SALPINGECTOMY (Bilateral),CYSTOSCOPYfor fibroids on 05/25/2019 by Dr. Philis Pique - s/p IR drain placement for largest pelvic abscess 12/21, culture pending - she remains distended and is passing no flatus, more likely ileus than SBO - Keep K>4 and Mg>2 for bowel function  FEN -IVF, NPO/NGT ID -zosyn 12/18>> VTE -SCDs, okay for chemical prophylaxis from a general surgery standpoint Foley -none Follow up -Dr. Philis Pique  Plan: Marland Kitchen Mobilize. Continue IV zosyn, IR drain, and follow cultures. K and Mag replaced by primary team. Will order PICC/TNA due to malnutrition (prealbumin 5.2) and prolonged intolerance to PO intake. -Right now it looks like her ileus or SBO is resolving clinically.  Will hold off on removing NG tube until we see the abdominal films, however.  Hopefully we can get the NG tube  out and start a liquid diet within the next 12 to 24 hours.    LOS: 5 days    Adin Hector 06/06/2019

## 2019-06-06 NOTE — Progress Notes (Signed)
Referring Physician(s): Dr Leane Para  Supervising Physician: Arne Cleveland  Patient Status:  [facility] - In-pt  Chief Complaint:  Pelvic abscesses s/pXI ROBOTIC ASSISTED LAPAROSCOPIC HYSTERECTOMY AND SALPINGECTOMY (Bilateral),CYSTOSCOPYfor fibroids on 05/25/2019 by Dr. Philis Pique  Subjective:  - s/p IR drain placement for largest pelvic abscess 12/21  Resting in bed Doing better today Passing flatus   Allergies: Patient has no known allergies.  Medications: Prior to Admission medications   Medication Sig Start Date End Date Taking? Authorizing Provider  albuterol (VENTOLIN HFA) 108 (90 Base) MCG/ACT inhaler Inhale 2 puffs into the lungs every 6 (six) hours as needed for wheezing or shortness of breath.   Yes [provider]  esomeprazole (NEXIUM) 20 MG capsule Take 20 mg by mouth every evening.   Yes [provider]  methimazole (TAPAZOLE) 5 MG tablet Take 0.5 tablets (2.5 mg total) by mouth daily. 09/18/18  Yes Shamleffer, Melanie Crazier, MD  oxyCODONE-acetaminophen (PERCOCET/ROXICET) 5-325 MG tablet Take 1-2 tablets by mouth every 4 (four) hours as needed for severe pain. Patient taking differently: Take 1 tablet by mouth 2 (two) times daily as needed for severe pain.  05/25/19  Yes Bobbye Charleston, MD  triamcinolone ointment (KENALOG) 0.1 % Apply 1 application topically daily as needed for irritation. 02/06/19  Yes [provider]  benzonatate (TESSALON) 200 MG capsule Take 1 capsule (200 mg total) by mouth 3 (three) times daily as needed for cough. Patient not taking: Reported on 06/01/2019 01/23/19   Collene Gobble, MD     Vital Signs: BP (!) 144/94 (BP Location: Left Arm)   Pulse (!) 104   Temp 98 F (36.7 C) (Oral)   Resp 16   Ht 5\' 6"  (1.676 m)   Wt 130 lb (59 kg)   LMP  (LMP Unknown)   SpO2 100%   BMI 20.98 kg/m   Physical Exam Vitals reviewed.  Skin:    General: Skin is warm and dry.     Comments: Site is clean and dry NT  no bleeding OP serosanguinous OP 55 ml yesterday  HEMATOMA DRAIN   Gram Stain FEW WBC PRESENT, PREDOMINANTLY PMN  FEW GRAM NEGATIVE RODS  Performed at [facility], [place] [address], [place], [place] [address]  Culture RARE GRAM NEGATIVE RODS    Neurological:     Mental Status: She is alert and oriented to person, place, and time.     Imaging: CT ABDOMEN PELVIS W CONTRAST  Result Date: 06/04/2019 CLINICAL DATA:  Pelvic abscesses. Partial bowel obstruction. EXAM: CT ABDOMEN AND PELVIS WITH CONTRAST TECHNIQUE: Multidetector CT imaging of the abdomen and pelvis was performed using the standard protocol following bolus administration of intravenous contrast. CONTRAST:  126mL OMNIPAQUE IOHEXOL 300 MG/ML  SOLN COMPARISON:  06/01/2019 FINDINGS: Lower chest: Faint reticulonodular opacity in the right lower lobe on image 1/4, probably related to atypical infectious process. Hepatobiliary: Gallbladder wall thickening is present. Dependent density in the gallbladder likely from sludge. Pancreas: Unremarkable Spleen: Unremarkable Adrenals/Urinary Tract: Unremarkable Stomach/Bowel: The orally administered contrast is only in dilated proximal loops of small bowel demonstrates progressive dilution in dilated loops of distal jejunum site of obstruction difficult to be certain of but probably in the vicinity of an angulated loop in the mid abdomen in the small bowel. There is a network of pelvic abscesses difficult to separate from small bowel loops. A representative abscess anterior to the rectum measures 5.2 by 4.5 by 3.9 cm (volume = 48 cm^3), previously 3.6 by 2.4  by 3.1 cm (volume = 14 cm^3). Other smaller abscesses and collections of free fluid are present in the pelvis, with fluid and a small amount of gas tracking along the pelvic sidewalls and right groin, and with free air along the right hepatic lobe margin although reduced in size compared to previous. In addition, the amount of subcutaneous edema  tracking along the groin, bilateral abdomen, and in the deep tissues the left breast is reduced compared to prior. Vascular/Lymphatic: Unremarkable Reproductive: Prior hysterectomy. Other: Stranding in the mesentery. Musculoskeletal: Unremarkable IMPRESSION: 1. The dominant abscess anterior to the rectum has increased in size compared to the prior exam, currently 48 cubic cm and previously 14 cubic cm. Other smaller abscesses are scattered in the pelvis. 2. Dilated loops of proximal small bowel extending down to a loop of distal jejunum or proximal ileum which takes an angulated course in the mid abdomen at the level of the iliac crests, and then courses inferiorly into the reticular network of pelvic abscesses. Possibilities include adhesion causing partial small bowel obstruction, versus ileus related to the abscesses. 3. Reduced volume of free intraperitoneal gas (but not resolved). Reduced volume of subcutaneous edema in the groin, bilateral abdomen, and pelvis. 4. Faint reticulonodular opacity in the right lower lobe, probably related to atypical infectious process. 5. Gallbladder wall thickening is nonspecific and could be from inflammation, hypoproteinemia, contraction, or hypoproteinemia. Electronically Signed   By: Van Clines M.D.   On: 06/04/2019 12:31   DG Abd Portable 1V  Result Date: 06/06/2019 CLINICAL DATA:  Abdominal pain. EXAM: PORTABLE ABDOMEN - 1 VIEW COMPARISON:  Abdominal CT from 2 days ago FINDINGS: Continued dilated small bowel preferentially. There is mottled gas over the left flank which is subcutaneous by recent CT. Extensive pelvic abscess on prior abdominal CT. Percutaneous catheter overlaps the central pelvis. Enteric tube with tip over the left abdomen, likely at the duodenal jejunal junction. The stomach does not appear distended enough for the tube to be looping through the stomach only. IMPRESSION: 1. Dilated small bowel with obstructive pattern that was also seen on  prior abdominal CT and secondary to the pelvic inflammation. 2. Enteric tube tip likely at the DJ junction. Electronically Signed   By: Monte Fantasia M.D.   On: 06/06/2019 10:04   CT IMAGE GUIDED DRAINAGE BY PERCUTANEOUS CATHETER  Result Date: 06/05/2019 INDICATION: 43 year old female status post uncomplicated robotic assisted hysterectomy. Her postoperative course was subsequently complicated by development of multiple intra-abdominal fluid collections. It is uncertain if these represent areas of hematoma or abscess. The largest collection in the pelvic cul-de-sac has enlarged over serial imaging and she now presents for transgluteal drainage. EXAM: CT-guided drain placement MEDICATIONS: The patient is currently admitted to the hospital and receiving intravenous antibiotics. The antibiotics were administered within an appropriate time frame prior to the initiation of the procedure. ANESTHESIA/SEDATION: Fentanyl 100 mcg IV; Versed 2 mg IV Moderate Sedation Time:  10 minutes The patient was continuously monitored during the procedure by the interventional radiology nurse under my direct supervision. COMPLICATIONS: None immediate. PROCEDURE: Informed written consent was obtained from the patient after a thorough discussion of the procedural risks, benefits and alternatives. All questions were addressed. Maximal Sterile Barrier Technique was utilized including caps, mask, sterile gowns, sterile gloves, sterile drape, hand hygiene and skin antiseptic. A timeout was performed prior to the initiation of the procedure. A planning axial CT scan was performed. The complex fluid collection in the pelvic cul-de-sac was successfully identified. A suitable skin  entry site from a right transgluteal approach was selected and marked. The overlying skin was sterilely prepped and draped in the standard fashion using chlorhexidine skin prep. Local anesthesia was attained by infiltration with 1% lidocaine. A small dermatotomy  was made. Under intermittent CT guidance, an 18 gauge trocar needle was advanced along a transgluteal, parasacral course into the fluid collection. A 0.035 wire was then coiled in the fluid collection. The needle was removed. The skin tract was dilated to 12 [place]. A Cook 12 [place] all-purpose drainage catheter was then advanced over the wire and formed. Aspiration yields 15-20 mL of dark bloody fluid. No frank purulence. Samples were sent for Gram stain and culture. The drain was gently flushed with saline and connected to JP bulb suction. The drain was then secured to the skin with 0 Prolene suture. Follow-up axial CT imaging demonstrates a well-positioned drainage catheter and no evidence of immediate complication. IMPRESSION: 1. Successful placement of a 99 French percutaneous drain via a right transgluteal approach. Aspiration yields a relatively small volume of dark bloody fluid. This appears to represent a liquified hematoma. Although no frank purulence was encountered, a sample of the aspirated blood was sent for Gram stain and culture. PLAN: Maintain drain to JP bulb suction. Flush drain with 10 cc saline once per shift. When drain output is scant (no more than 5-10 mL over the volume of flushes) for 48 hours or longer the drainage catheter can be removed. Signed, Criselda Peaches, MD, [facility] Electronically Signed   By: Jacqulynn Cadet M.D.   On: 06/05/2019 08:26   [place] EKG SITE RITE  Result Date: 06/05/2019 If Site Rite image not attached, placement could not be confirmed due to current cardiac rhythm.   Labs:  CBC: Recent Labs    06/01/19 0228 06/02/19 0401 06/05/19 0128 06/06/19 0449  WBC 15.3* 12.2* 10.1 10.1  HGB 11.3* 9.2* 7.8* 8.0*  HCT 35.1* 28.7* 24.5* 25.7*  PLT 290 300 285 364    COAGS: Recent Labs    06/01/19 0243  INR 1.1  APTT 35    BMP: Recent Labs    06/01/19 0228 06/02/19 0401  06/05/19 0128 06/06/19 0449  NA 137 142 142 142  K 3.6 3.7 3.0* 3.6  CL 101 106 103 104  CO2 24 24 25 26   GLUCOSE 139* 98 97 140*  BUN 11 8 6  <5*  CALCIUM 8.9 8.0* 7.8* 7.8*  CREATININE 0.77 0.71 0.74 0.49  GFRNONAA >60 >60 >60 >60  GFRAA >60 >60 >60 >60    LIVER FUNCTION TESTS: Recent Labs    08/18/18 0819 06/01/19 0228 06/02/19 0401 06/06/19 0449  BILITOT 0.7 2.7* 1.6* 1.2  AST 20 14* 13* 42*  ALT 24 14 11 22   ALKPHOS 49 81 82 59  PROT 7.2 8.1 6.0* 5.6*  ALBUMIN 4.5 3.9 2.6* 2.5*    Assessment and Plan:  Pelvic abscess drain intact Will follow If home with drain will need to flush daily 5-10 ml sterile saline OP orders in place She will hear from [facility] for appt time and date  Electronically Signed: Lavonia Drafts, PA-C 06/06/2019, 2:03 PM   I spent a total of 15 Minutes at the the patient's bedside AND on the patient's hospital floor or unit, greater than 50% of which was counseling/coordinating care for TG pelvic abscess drain

## 2019-06-07 LAB — URINE CULTURE: Culture: NO GROWTH

## 2019-06-07 LAB — COMPREHENSIVE METABOLIC PANEL
ALT: 20 U/L (ref 0–44)
AST: 28 U/L (ref 15–41)
Albumin: 2.3 g/dL — ABNORMAL LOW (ref 3.5–5.0)
Alkaline Phosphatase: 53 U/L (ref 38–126)
Anion gap: 9 (ref 5–15)
BUN: <5 mg/dL — ABNORMAL LOW (ref 6–20)
CO2: 29 mmol/L (ref 22–32)
Calcium: 8 mg/dL — ABNORMAL LOW (ref 8.9–10.3)
Chloride: 103 mmol/L (ref 98–111)
Creatinine, Ser: 0.53 mg/dL (ref 0.44–1.00)
GFR calc Af Amer: >60 mL/min (ref >60)
GFR calc non Af Amer: >60 mL/min (ref >60)
Glucose, Bld: 122 mg/dL — ABNORMAL HIGH (ref 70–99)
Potassium: 3.2 mmol/L — ABNORMAL LOW (ref 3.5–5.1)
Sodium: 141 mmol/L (ref 135–145)
Total Bilirubin: 0.5 mg/dL (ref 0.3–1.2)
Total Protein: 5.7 g/dL — ABNORMAL LOW (ref 6.5–8.1)

## 2019-06-07 LAB — GLUCOSE, CAPILLARY
Glucose-Capillary: 104 mg/dL — ABNORMAL HIGH (ref 70–99)
Glucose-Capillary: 125 mg/dL — ABNORMAL HIGH (ref 70–99)
Glucose-Capillary: 93 mg/dL (ref 70–99)

## 2019-06-07 LAB — PHOSPHORUS: Phosphorus: 3.1 mg/dL (ref 2.5–4.6)

## 2019-06-07 LAB — MAGNESIUM: Magnesium: 2.2 mg/dL (ref 1.7–2.4)

## 2019-06-07 MED ORDER — BOOST / RESOURCE BREEZE PO LIQD CUSTOM
1.0000 | Freq: Three times a day (TID) | ORAL | Status: DC
  Administered 2019-06-07 – 2019-06-10 (×7): 1 via ORAL

## 2019-06-07 MED ORDER — POTASSIUM CHLORIDE 10 MEQ/50ML IV SOLN
10.0000 meq | INTRAVENOUS | Status: AC
  Administered 2019-06-07 (×4): 10 meq via INTRAVENOUS
  Filled 2019-06-07 (×4): qty 50

## 2019-06-07 MED ORDER — TRAVASOL 10 % IV SOLN
INTRAVENOUS | Status: AC
  Filled 2019-06-07: qty 513.6

## 2019-06-07 NOTE — Progress Notes (Signed)
Subjective/Chief Complaint: PT doing well + flatus/ BM Feels less distended. No n/v with NGT clamped.  Tol CLD   Objective: Vital signs in last 24 hours: Temp:  [98.2 F (36.8 C)-98.6 F (37 C)] 98.4 F (36.9 C) (12/24 0643) Pulse Rate:  [91-96] 91 (12/24 0643) Resp:  [16-17] 17 (12/24 0643) BP: (137-144)/(79-94) 137/79 (12/24 0643) SpO2:  [98 %-100 %] 100 % (12/24 0643) Last BM Date: 06/06/19  Intake/Output from previous day: 12/23 0701 - 12/24 0700 In: 3485.7 [P.O.:100; I.V.:3085.7; IV Piggyback:300] Out: 405 [Urine:400; Drains:5] Intake/Output this shift: No intake/output data recorded.  Constitutional: No acute distress, conversant, appears states age. Eyes: Anicteric sclerae, moist conjunctiva, no lid lag Lungs: Clear to auscultation bilaterally, normal respiratory effort CV: regular rate and rhythm, no murmurs, no peripheral edema, pedal pulses 2+ GI: Soft, no masses or hepatosplenomegaly, non-tender to palpation Skin: No rashes, palpation reveals normal turgor Psychiatric: appropriate judgment and insight, oriented to person, place, and time   Lab Results:  Recent Labs    06/05/19 0128 06/06/19 0449  WBC 10.1 10.1  HGB 7.8* 8.0*  HCT 24.5* 25.7*  PLT 285 364   BMET Recent Labs    06/06/19 0449 06/07/19 0616  NA 142 141  K 3.6 3.2*  CL 104 103  CO2 26 29  GLUCOSE 140* 122*  BUN <5* <5*  CREATININE 0.49 0.53  CALCIUM 7.8* 8.0*   PT/INR No results for input(s): LABPROT, INR in the last 72 hours. ABG No results for input(s): PHART, HCO3 in the last 72 hours.  Invalid input(s): PCO2, PO2  Studies/Results: DG Abd Portable 1V  Result Date: 06/06/2019 CLINICAL DATA:  Abdominal pain. EXAM: PORTABLE ABDOMEN - 1 VIEW COMPARISON:  Abdominal CT from 2 days ago FINDINGS: Continued dilated small bowel preferentially. There is mottled gas over the left flank which is subcutaneous by recent CT. Extensive pelvic abscess on prior abdominal CT.  Percutaneous catheter overlaps the central pelvis. Enteric tube with tip over the left abdomen, likely at the duodenal jejunal junction. The stomach does not appear distended enough for the tube to be looping through the stomach only. IMPRESSION: 1. Dilated small bowel with obstructive pattern that was also seen on prior abdominal CT and secondary to the pelvic inflammation. 2. Enteric tube tip likely at the DJ junction. Electronically Signed   By: Monte Fantasia M.D.   On: 06/06/2019 10:04   [place] EKG SITE RITE  Result Date: 06/05/2019 If Site Rite image not attached, placement could not be confirmed due to current cardiac rhythm.   Anti-infectives: Anti-infectives (From admission, onward)   Start     Dose/Rate Route Frequency Ordered Stop   06/06/19 1600  Ampicillin-Sulbactam (UNASYN) 3 g in sodium chloride 0.9 % 100 mL IVPB     3 g 200 mL/hr over 30 Minutes Intravenous Every 6 hours 06/06/19 0836     06/01/19 0600  piperacillin-tazobactam (ZOSYN) IVPB 3.375 g  Status:  Discontinued     3.375 g 12.5 mL/hr over 240 Minutes Intravenous Every 8 hours 06/01/19 0521 06/06/19 0834   06/01/19 0245  ceFEPIme (MAXIPIME) 2 g in sodium chloride 0.9 % 100 mL IVPB     2 g 200 mL/hr over 30 Minutes Intravenous  Once 06/01/19 0244 06/01/19 0451   06/01/19 0245  metroNIDAZOLE (FLAGYL) IVPB 500 mg     500 mg 100 mL/hr over 60 Minutes Intravenous  Once 06/01/19 0244 06/01/19 0451      Assessment/Plan: Graves disease  Pelvic abscesses  Ileus vs pSBO - resolved  Bacteremia  s/p XI ROBOTIC ASSISTED LAPAROSCOPIC HYSTERECTOMY AND SALPINGECTOMY (Bilateral), CYSTOSCOPY for fibroids on 05/25/2019 by Dr. Philis Pique  - s/p IR drain placement for largest pelvic abscess 12/21, culture pending  - ileus appears resolved and tol NGT clamping and Clears.  OK to adv to full and then as tol - Keep K>4 and Mg>2 for bowel function   FEN - TNA, Full liq diet then ADAT  ID - zosyn 12/18>>   VTE - SCDs, okay for  chemical prophylaxis from a general surgery standpoint   Foley - none   Follow up - Dr. Philis Pique   Plan: .  -Mobilize.  -Continue IV zosyn, IR drain, and follow cultures.  -K and Mag to be replaced by primary team.  -If tol Fulls OK to wean TNA today and off tomorrow. -DC NGT  Please call back if any issues.   LOS: 6 days    Ralene Ok 06/07/2019

## 2019-06-07 NOTE — Progress Notes (Signed)
Nutrition Follow-up  DOCUMENTATION CODES:   Not applicable  INTERVENTION:   -TPN management per pharmacy -Boost Breeze po TID, each supplement provides 250 kcal and 9 grams of protein -Provided "Low Fiber Nutrition Therapy" handout from [facility] Nutrition Care Manual; attached to AVS/ discharge summary  NUTRITION DIAGNOSIS:   Inadequate oral intake related to altered GI function as evidenced by NPO status.  Progressing; advanced to clear liquid diet and on TPN  GOAL:   Patient will meet greater than or equal to 90% of their needs  Progressing   MONITOR:   PO intake, Supplement acceptance, Diet advancement, Labs, Weight trends, Skin, I & O's  REASON FOR ASSESSMENT:   Consult New TPN/TNA  ASSESSMENT:   43 yo S/P robotic hysterectomy on 12/11 presents to ED c/o fever and worsening nausea. Patient developed a fever on the afternoon of 12/17. Initially this was improved by tylenol but throughout the evening symptoms became worse and she presented to [facility] ED for evaluation.  12/18- NGT placed 12/21- CT revealed partial SBO, s/p drain placement by IR 12/22- PICC placed, TPN initiated 12/24- NGT removed, advanced to clears  Pt in with RN at time of visit.   Noted NGT removed and was started on clear liquids. Meal completion documented at 0%. Per general surgery notes, plan to advance diet as tolerated.   Pt remains on TPN at 50 ml/hr, which provides 1241 kcals and 64 grams protein, meeting 71% of estimated kcal needs and 71% of estimated protein needs. Per pharmacy notes, plan to reduce TPN today to 40 ml/hr at 1800, which provides 993 kcals and 51 grams protein, meeting 58% of estimated kcal needs and 57% of estimated protein needs. TPN may possibly be d/c tomorrow.   Labs reviewed: K: 3.2, Mg and Phos. CBGS: 119-126.   Diet Order:   Diet Order            Diet clear liquid Room service appropriate? Yes; Fluid consistency: Thin  Diet effective now              EDUCATION  NEEDS:   Education needs have been addressed  Skin:  Skin Assessment: Skin Integrity Issues: Skin Integrity Issues:: Incisions Incisions: closed vaginal  Last BM:  06/07/19  Height:   Ht Readings from Last 1 Encounters:  06/01/19 5\' 6"  (1.676 m)    Weight:   Wt Readings from Last 1 Encounters:  06/01/19 59 kg    Ideal Body Weight:  59.1 kg  BMI:  Body mass index is 20.98 kg/m.  Estimated Nutritional Needs:   Kcal:  [id number]  Protein:  90-105 grams  Fluid:  > 1.7 L    Kellye Mizner A. Jimmye Norman, RD, LDN, [place] Registered Dietitian II Certified Diabetes Care and Education Specialist Pager: [phone] After hours Pager: [phone]

## 2019-06-07 NOTE — Progress Notes (Signed)
Patient  ambulating and voiding.  Pain control is good.  NG tube d/ced this am and started on clears.   Vitals:   06/06/19 0641 06/06/19 1512 06/06/19 2042 06/07/19 0643  BP: (!) 144/94 (!) 143/92 (!) 144/94 137/79  Pulse: (!) 104 96 94 91  Resp: 16 16 16 17   Temp: 98 F (36.7 C) 98.2 F (36.8 C) 98.6 F (37 C) 98.4 F (36.9 C)  TempSrc: Oral Axillary Oral Oral  SpO2: 100% 98% 100% 100%  Weight:      Height:        lungs:   clear to auscultation cor:    RRR Abdomen:  soft, appropriate tenderness, incisions intact and without erythema or exudate. ex:    no cords   Results for orders placed or performed during the hospital encounter of 06/01/19 (from the past 24 hour(s))  Glucose, capillary     Status: Abnormal   Collection Time: 06/06/19 11:49 AM  Result Value Ref Range   Glucose-Capillary 126 (H) 70 - 99 mg/dL  Glucose, capillary     Status: Abnormal   Collection Time: 06/06/19  6:13 PM  Result Value Ref Range   Glucose-Capillary 119 (H) 70 - 99 mg/dL  Glucose, capillary     Status: Abnormal   Collection Time: 06/07/19 12:23 AM  Result Value Ref Range   Glucose-Capillary 125 (H) 70 - 99 mg/dL   Comment 1 Notify RN    Comment 2 Document in Chart   Comprehensive metabolic panel     Status: Abnormal   Collection Time: 06/07/19  6:16 AM  Result Value Ref Range   Sodium 141 135 - 145 mmol/L   Potassium 3.2 (L) 3.5 - 5.1 mmol/L   Chloride 103 98 - 111 mmol/L   CO2 29 22 - 32 mmol/L   Glucose, Bld 122 (H) 70 - 99 mg/dL   BUN <5 (L) 6 - 20 mg/dL   Creatinine, Ser 0.53 0.44 - 1.00 mg/dL   Calcium 8.0 (L) 8.9 - 10.3 mg/dL   Total Protein 5.7 (L) 6.5 - 8.1 g/dL   Albumin 2.3 (L) 3.5 - 5.0 g/dL   AST 28 15 - 41 U/L   ALT 20 0 - 44 U/L   Alkaline Phosphatase 53 38 - 126 U/L   Total Bilirubin 0.5 0.3 - 1.2 mg/dL   GFR calc non Af Amer >60 >60 mL/min   GFR calc Af Amer >60 >60 mL/min   Anion gap 9 5 - 15  Magnesium     Status: None   Collection Time: 06/07/19  6:16 AM   Result Value Ref Range   Magnesium 2.2 1.7 - 2.4 mg/dL  Phosphorus     Status: None   Collection Time: 06/07/19  6:16 AM  Result Value Ref Range   Phosphorus 3.1 2.5 - 4.6 mg/dL  Glucose, capillary     Status: Abnormal   Collection Time: 06/07/19  6:40 AM  Result Value Ref Range   Glucose-Capillary 104 (H) 70 - 99 mg/dL   Comment 1 Notify RN    Comment 2 Document in Chart    A/P HD #5, POD #13 Robotic TLH/salpingegectomies.  1. Cultrue of serous fluid with a few gram negative rods.  Pharm believes that likely will be same bacteria as was in blood culture.  Pt now on Unasyn.. Pt remains afebrile. 2.  S/p drainage of main fluid collection.  JP in place and drained 5cc of bloody fluid yesterday.  Continue until output is <5  ml for 48 hours.  Vaginal spotting likely from interruption of fluid collection in cul de sac.  Vagina intact and no active bleeding. No discharge like yeast either.  3.  General surgery following and helping with care of patient (thank you!)  K+, Mg low again today- replete again.  Nutrition now following and started TPN.   4.  Started lovenox now all procedures are done. 5.  UA showed no LE and <5 WBC.  Better today and no vaginal discharge. 6.TPN continuing until pt tolerating regular diet. 7.  Per General Surgery,NG tube out ast Xray showing some resolution.

## 2019-06-07 NOTE — Discharge Instructions (Signed)
Low Fiber Nutrition Therapy  ° °You may need a low-fiber diet if you have Crohn's disease, diverticulitis, gastroparesis, ulcerative colitis, a new colostomy, or new ileostomy. A low-fiber diet may also be needed following radiation therapy to the pelvis and lower bowel or recent intestinal surgery. ° °A low-fiber diet reduces the frequency and volume of your stools. This lessens irritation to the gastrointestinal (GI) tract and can help you heal. Use this diet if you have a stricture so your intestine doesn't get blocked. The goal of this diet is to get less than 8 grams of fiber daily. It's also important to eat enough protein foods while you are on a low-fiber diet. ° °Drink nutrition supplements that have 1 gram of fiber or less in each serving. If your stricture is severe or if your inflammation is severe, drink more liquids to reduce symptoms and to get enough calories and protein. ° °Tips °Eat about 5 to 6 small meals daily or about every 3 to 4 hours. Do not skip meals.  °Every time you eat, include a small amount of protein (1 to 2 ounces) plus an additional food. Low fiber starch foods are the best choice to eat with protein.  °Limit acidic, spicy and high-fat or fried and greasy foods to reduce GI symptoms.  °Do not eat raw fruits and vegetables while on this diet. All fruits and vegetables need to be cooked and without peels or skins.  °Drink a lot of fluids, at least 8 cups of fluid each day. Limit drinks with caffeine, sugar, and sugar substitutes.  °Plain water is the best choice. Avoid mixing drink packets or flavor drops into water. .  °Take a chewable multivitamin with minerals. Gummy vitamins do not have enough minerals and can block an ostomy and non-chewable supplements are not easily digested. Chewable supplements must be used if you have a stricture or ostomy.  °If you are lactose intolerant, you may need to eat low-lactose dairy products. If you can't tolerate dairy, ask your RDN about how  you can get enough calcium from other foods.  °Do not take a calcium supplement. They can cause a blockage.  °It is important to add high-calcium foods gradually to your diet and monitor for symptoms to avoid a blockage.  °Do not add more fiber to your diet until your health care provider or registered dietitian nutritionist (RDN) tells you it's OK. Fiber is part of whole grains, fruits and vegetables (foods from plants) and needs to be slowly added back in to your diet when your body is healed.  °Choose foods that have been safely handled and prepared to lower your risk of foodborne illness. Talk to your RDN or see the Food Safety Nutrition Therapy handout for more information.  ° °Foods Recommended °These foods are low in fat and fiber and will help with your GI symptoms. °Food Group Foods Recommended  °Grains  Choose grain foods with less than 2 grams of fiber per serving. °Refined white flour products--for example, enriched white bread without seeds, crackers or pasta °Cream of wheat or rice °Grits (fine ground) °Tortillas: white flour or corn °White rice, well-cooked (do not rinse, or soak before cooking) °Cold and hot cereals made from white or refined flour such as puffed rice or corn flakes  °Protein Foods  Lean, very tender, well-cooked poultry or fish; red meats: beef, pork or lamb (slow cook until soft; chop meats if you have stricture or ostomy) °Eggs, well-cooked °Smooth nut butters such as almond,   peanut, or sunflower °Tofu  °Dairy  If you have lactose intolerance, drinking milk products from cows or goats may make diarrhea worse. Foods marked with an asterisk (*) have lactose. °Milk: fat-free, 1% or 2% * (choose best tolerated) °Lactose-free milk °Buttermilk* °Fortified non-dairy milks: almond, cashew, coconut, or rice (be aware that these options are not good sources of protein so you will need to eat an additional protein food) °Kefir* (Don't include kefir in the diet until approved by your health  care provider) °Yogurt*/lactose-free yogurt (without nuts, fruit, granola or chocolate) °Mild cheese* (hard and aged cheeses tend to be lower in lactose such as cheddar, swiss or parmesan) °Cottage cheese* or lactose-free cottage cheese °Low-fat ice cream* or lactose-free ice cream °Sherbet* (usually lower lactose)  °Vegetables  Canned and well-cooked vegetables without seeds, skins, or hulls  °Carrots or green beans, cooked °White, red or yellow potatoes without skins °Strained vegetable juice  °Fruit Soft, and well-cooked fruits without skins, seeds, or membranes °Canned fruit in juice: peaches, pears, or applesauce °Fruit juice without pulp diluted by half with water may be tolerated better °Fruit drinks fortified with vitamin C may be tolerated better than 100% fruit juice  °Oils  When possible, choose healthy oils and fats, such as olive and canola oils, plant oils rather than solid fats.  °Other  Broth and strained soups made from allowed foods °Desserts (small portions) without whole grains, seeds, nuts, raisins, or coconut °Jelly (clear)  ° °Foods Not Recommended °These foods are higher in fat and fiber and may make your GI symptoms worse.  °Food Group Foods Not Recommended  °Grains  Bread, whole wheat or with whole grain flour or seeds or nuts °[place] rice, quinoa, kasha, barley °Tortillas: whole grain °Whole wheat pasta °Whole grain and high-fiber cereals, including oatmeal, bran flakes or shredded wheat °Popcorn  °Protein Foods  Steak, pork chops, or other meats that are fatty or have gristle °Fried meat, poultry, or fish °Seafood with a tough or rubbery texture, such as shrimp °Luncheon meats such as bologna and salami °Sausage, bacon, or hot dogs °Dried beans, peas, or lentils °Hummus °Sushi °Nuts and chunky nut butters  °Dairy  Whole milk °Pea milk and soymilk (may cause diarrhea, gas, bloating, and abdominal pain) °Cream °Half-and-half °Sour cream °Yogurt with added fruit, nuts, or granola or chocolate   °Vegetables  Alfalfa or bean sprouts (high fiber and risk for bacteria) °Raw or undercooked vegetables: beets; broccoli; brussels sprouts; cabbage; cauliflower; collard, mustard, or turnip greens; corn; cucumber; green peas or any kind of peas; kale; lima beans; mushrooms; okra; olives; pickles and relish; onions; parsnips; peppers; potato skins; sauerkraut; spinach; tomatoes  °Fruit Raw fruit °Dried fruit °Avocado, berries, coconut °Canned fruit in syrup °Canned fruit with mandarin oranges, papaya or pineapple °Fruit juice with pulp °Prune juice °Fruit skin  °Oils  Pork rinds  ° °Low-Fiber (8 grams) Sample 1-Day Menu  °Breakfast ½ cup cream of wheat (0.5 gram fiber)  °1 slice white toast (1 gram fiber)  °1 teaspoon margarine, soft tub  °2 scrambled eggs   °Morning Snack 1 cup lactose-free nutrition supplement  °Lunch 2 slices white bread (2 grams fiber)  °3 tablespoons tuna  °1 tablespoon mayonnaise  °1 cup chicken noodle soup (1 gram fiber)  °½ cup apple juice   °Afternoon Snack 6 saltine crackers (0.5 gram fiber)  °2 ounces low-fat cheddar cheese  °Evening Meal 3 ounces tender chicken breast  °1 cup white rice (0.5 gram fiber)  °½ cup   cooked canned green beans (2 grams fiber)  °½ cup cranberry juice   °Evening Snack 1 cup lactose-free nutrition supplement  ° °Copyright 2020 © [employer] ° °High Fiber Nutrition Therapy  °Fiber and fluid may help you feel less constipated and bloated and can also help ease diarrhea. Increase fiber slowly over the course of a few weeks. This will keep your symptoms from getting worse. °Tips °Tips for Adding Fiber to Your Eating Plan °Slowly increase the amount of fiber you eat to 25 to 35 grams per day. °Eat whole grain breads and cereals. Look for choices with 100% whole wheat, rye, oats, or bran as the first or second ingredient. °Have [place] or wild rice instead of white rice or potatoes. °Enjoy a variety of grains. Good choices include barley, oats,  farro, kamut, and quinoa. °Bake with whole wheat flour. You can use it to replace some white or all-purpose flour in recipes. °Enjoy baked beans more often! Add dried beans and peas to casseroles or soups. °Choose fresh fruit and vegetables instead of juices. °Eat fruits and vegetables with peels or skins on. °Compare food labels of similar foods to find higher fiber choices. On packaged foods, the amount of fiber per serving is listed on the Nutrition Facts label. °Check the Nutrition Facts labels and try to choose products with at least 4 g dietary fiber per serving. °Drink plenty of fluids. Set a goal of at least 8 cups per day. You may need even more fluid as you eat higher amounts of fiber. Fluid helps your body process fiber without discomfort. °Foods Recommended °Foods With at Least 4 g Fiber per Serving °Food Group Choose  °Grains ?-½ cup high-fiber cereal  °Dried beans and peas ½ cup cooked red beans, kidney beans, large lima beans, navy beans, pinto beans, white beans, lentils, or black-eyed peas  °Vegetables 1 artichoke (cooked)  °Fruits ½ cup blackberries or raspberries °4 dried prunes  ° °Foods With 1 to 3 g Fiber per Serving °Food Group Choose  °Grains 1 bagel (3.5-inch diameter) °1 slice whole wheat, cracked wheat, pumpernickel, or rye bread °2-inch square cornbread °4 whole wheat crackers °1 bran, blueberry, cornmeal, or English muffin °½ cup cereal with 1-3 g fiber per serving (check dietary fiber on the product's Nutrition Facts label) °2 tablespoons wheat germ or whole wheat flour  °Fruits 1 apple (3-inch diameter) or ½ cup applesauce °½ cup apricots (canned) °1 banana °½ cup cherries (canned or fresh) °½ cup cranberries (fresh) °3 dates °2 medium figs (fresh) °½ cup fruit cocktail (canned) °½ grapefruit °1 kiwi fruit °1 orange (2½-inch diameter) °1 peach (fresh) or ½ cup peaches (canned) °1 pear (fresh) or ½ cup pears (canned) °1 plum (2-inch diameter) °¼ cup raisins °½ cup strawberries  (fresh) °1 tangerine  °Vegetables ½ cup bean sprouts (raw) °½ cup beets (diced, canned) °½ cup broccoli, brussels sprouts, or cabbage  (cooked) °½ cup carrots °½ cup cauliflower °½ cup corn °½ cup eggplant °½ cup okra (boiled) °½ cup potatoes (baked or mashed) °½ cup spinach, kale, or turnip greens (cooked) °½ cup squash--winter, summer, or zucchini (cooked) °½ cup sweet potatoes or yams °½ cup tomatoes (canned)  °Other 2 tablespoons almonds or peanuts °1 cup popcorn (popped)  ° °High Fiber Vegetarian (Lacto-Ovo) Sample 1-Day Menu  °Breakfast ½ cup bran cereal  °1 banana °½ cup blueberries °1 cup 1% milk  °Lunch 2 slices whole wheat bread  °2 tablespoons hummus °1 ounce cheddar cheese °  1 leaf lettuce °2 slices tomato °½ cup vegetarian baked beans °1 orange °1 cup 1% milk  °Evening Meal Stir fry made with: ½ cup tempeh  °½ cup [place] rice °1 cup frozen broccoli °1 tablespoon soy sauce °¼ cup peanuts  °1 pear  °Evening Snack 6 ounces fruit yogurt  °1 cup air popped popcorn  °High Fiber Vegan Sample 1-Day Menu  °Breakfast ½ cup bran cereal  °1 banana °½ cup blueberries °1 cup soymilk fortified with calcium, vitamin B12, and vitamin D  °Lunch ½ cup chili with beans with:  °½ cup tempeh crumbles °¼ cup crushed whole wheat crackers °1 apple °1 cup soymilk fortified with calcium, vitamin B12, and vitamin D  °Evening Meal 1 veggie burger  °1 whole wheat bun  °1 leaf lettuce  °1 slice tomato °Salad made with: 1 cup lettuce °¼ cup chickpeas  °½ cucumbers °1 tablespoon italian dressing °1 cup strawberries   °Evening Snack ¼ cup almonds  °1 cup carrot sticks  °High Fiber Sample 1-Day Menu  °Breakfast 1/2 cup orange juice, with pulp  °1/2 cup raisin bran °1 cup fat-free milk °1 cup coffee  °Morning Snack 1 cup plain yogurt  °2 cups water  °Lunch 1 1/2 cups chili  °1/2 cup kidney beans °1/2 cup soy crumble °2 tablespoons shredded cheese °8 whole wheat crackers °1 apple (with skin)   °Evening Meal 2 ounces sliced chicken  °1/4 cup  tofu °2 cups mixed fresh vegetables °1 cup [place] rice °1/2 cup strawberries °1 cup hot tea  °Evening Snack 2 tablespoons almonds  °1 cup hot chocolate  ° °Copyright 2020 © Academy [employer] ° °

## 2019-06-07 NOTE — Progress Notes (Signed)
PHARMACY - TOTAL PARENTERAL NUTRITION CONSULT NOTE  Indication: malnutrition/prolonged intolerance to PO intake  Patient Measurements: Height: 5\' 6"  (167.6 cm) Weight: 130 lb (59 kg) IBW/kg (Calculated) : 59.3 TPN AdjBW (KG): 59 Body mass index is 20.98 kg/m.  Assessment:  30 YOF who presented for robotic assisted laparoscopic hysterectomy and BL salpingectomy for fibroids on 12/11. No issues with surgery but presented to ED on 12/18 with worsening nausea, abdominal pain and fever. ED ET scan showed complex fluid and gas collections throughout pelvis and abdomen.   Glucose / Insulin: no hx DM - CBGs controlled. Received 3 units SSI in the last 24 hours Electrolytes: K 3.2, all other WNL Renal: SCr 0.53, BUN < 5 LFTs / TGs: LFTs/Tbili WNL, TG WNL Prealbumin: 5.9 Intake / Output; MIVF: No NG O/P documented, UOP 0.74mL/kg/hr, drain O/P 98mL - LR at 100 ml/hr GI Imaging: 12/21 CT abd - abscess anterior to rectum w increasing size, smaller abscesses scattered in pelvis. Also SBO vs ileus  12/23 Abd xray: Dilated small bowel with obstructive pattern that was also seen on prior abdominal CT and secondary to the pelvic inflammation Surgeries / Procedures: 12/11 hysterectomy, 12/21 IR drain placement  Central access: PICC placed 06/05/19 TPN start date: 06/05/19  Nutritional Goals (per RD rec 12/23): 1750-1950 kCal, 90-105g protein, ~1.7L fluid per day   Current Nutrition:  TPN  Plan:  Reduce TPN to 40 ml/hr as plan to DC tomorrow TPN will provide 51g AA, 144g CHO and 30g ILE for a total of 993 kCal, meeting ~57% of patient needs Electrolytes in TPN: increase K, Cl:Ac 1:1 Daily trace elements in TPN Continue prenatal multivitamin PO daily DC SSI KCl 30meq IV Q1H x 4 runs F/U diet and ability to DC TPN tomorrow   Salome Arnt, PharmD, BCPS Clinical Pharmacist Please see AMION for all pharmacy numbers 06/07/2019 8:08 AM

## 2019-06-07 NOTE — Progress Notes (Signed)
Referring Physician(s): Ingram,H  Supervising Physician: Arne Cleveland  Patient Status:  [facility] - In-pt  Chief Complaint: Pelvic fluid collection   Subjective: Pt sitting up in chair; currently without new c/o; denies worsening abd/pelvic pain,N/V   Allergies: Patient has no known allergies.  Medications: Prior to Admission medications   Medication Sig Start Date End Date Taking? Authorizing Provider  albuterol (VENTOLIN HFA) 108 (90 Base) MCG/ACT inhaler Inhale 2 puffs into the lungs every 6 (six) hours as needed for wheezing or shortness of breath.   Yes [provider]  esomeprazole (NEXIUM) 20 MG capsule Take 20 mg by mouth every evening.   Yes [provider]  methimazole (TAPAZOLE) 5 MG tablet Take 0.5 tablets (2.5 mg total) by mouth daily. 09/18/18  Yes Shamleffer, Melanie Crazier, MD  oxyCODONE-acetaminophen (PERCOCET/ROXICET) 5-325 MG tablet Take 1-2 tablets by mouth every 4 (four) hours as needed for severe pain. Patient taking differently: Take 1 tablet by mouth 2 (two) times daily as needed for severe pain.  05/25/19  Yes Bobbye Charleston, MD  triamcinolone ointment (KENALOG) 0.1 % Apply 1 application topically daily as needed for irritation. 02/06/19  Yes [provider]  benzonatate (TESSALON) 200 MG capsule Take 1 capsule (200 mg total) by mouth 3 (three) times daily as needed for cough. Patient not taking: Reported on 06/01/2019 01/23/19   Collene Gobble, MD     Vital Signs: BP 137/79 (BP Location: Left Arm)   Pulse 91   Temp 98.4 F (36.9 C) (Oral)   Resp 17   Ht 5\' 6"  (1.676 m)   Wt 130 lb (59 kg)   LMP  (LMP Unknown)   SpO2 100%   BMI 20.98 kg/m   Physical Exam awake/alert; rt TG drain intact, insertion site ok, not sig tender, output small amount bloody fluid; drain flushed with saline with minimal return  Imaging: CT ABDOMEN PELVIS W CONTRAST  Result Date: 06/04/2019 CLINICAL DATA:  Pelvic abscesses. Partial bowel  obstruction. EXAM: CT ABDOMEN AND PELVIS WITH CONTRAST TECHNIQUE: Multidetector CT imaging of the abdomen and pelvis was performed using the standard protocol following bolus administration of intravenous contrast. CONTRAST:  151mL OMNIPAQUE IOHEXOL 300 MG/ML  SOLN COMPARISON:  06/01/2019 FINDINGS: Lower chest: Faint reticulonodular opacity in the right lower lobe on image 1/4, probably related to atypical infectious process. Hepatobiliary: Gallbladder wall thickening is present. Dependent density in the gallbladder likely from sludge. Pancreas: Unremarkable Spleen: Unremarkable Adrenals/Urinary Tract: Unremarkable Stomach/Bowel: The orally administered contrast is only in dilated proximal loops of small bowel demonstrates progressive dilution in dilated loops of distal jejunum site of obstruction difficult to be certain of but probably in the vicinity of an angulated loop in the mid abdomen in the small bowel. There is a network of pelvic abscesses difficult to separate from small bowel loops. A representative abscess anterior to the rectum measures 5.2 by 4.5 by 3.9 cm (volume = 48 cm^3), previously 3.6 by 2.4 by 3.1 cm (volume = 14 cm^3). Other smaller abscesses and collections of free fluid are present in the pelvis, with fluid and a small amount of gas tracking along the pelvic sidewalls and right groin, and with free air along the right hepatic lobe margin although reduced in size compared to previous. In addition, the amount of subcutaneous edema tracking along the groin, bilateral abdomen, and in the deep tissues the left breast is reduced compared to prior. Vascular/Lymphatic: Unremarkable Reproductive: Prior hysterectomy. Other: Stranding in the mesentery. Musculoskeletal: Unremarkable IMPRESSION: 1.  The dominant abscess anterior to the rectum has increased in size compared to the prior exam, currently 48 cubic cm and previously 14 cubic cm. Other smaller abscesses are scattered in the pelvis. 2. Dilated  loops of proximal small bowel extending down to a loop of distal jejunum or proximal ileum which takes an angulated course in the mid abdomen at the level of the iliac crests, and then courses inferiorly into the reticular network of pelvic abscesses. Possibilities include adhesion causing partial small bowel obstruction, versus ileus related to the abscesses. 3. Reduced volume of free intraperitoneal gas (but not resolved). Reduced volume of subcutaneous edema in the groin, bilateral abdomen, and pelvis. 4. Faint reticulonodular opacity in the right lower lobe, probably related to atypical infectious process. 5. Gallbladder wall thickening is nonspecific and could be from inflammation, hypoproteinemia, contraction, or hypoproteinemia. Electronically Signed   By: Van Clines M.D.   On: 06/04/2019 12:31   DG Abd Portable 1V  Result Date: 06/06/2019 CLINICAL DATA:  Abdominal pain. EXAM: PORTABLE ABDOMEN - 1 VIEW COMPARISON:  Abdominal CT from 2 days ago FINDINGS: Continued dilated small bowel preferentially. There is mottled gas over the left flank which is subcutaneous by recent CT. Extensive pelvic abscess on prior abdominal CT. Percutaneous catheter overlaps the central pelvis. Enteric tube with tip over the left abdomen, likely at the duodenal jejunal junction. The stomach does not appear distended enough for the tube to be looping through the stomach only. IMPRESSION: 1. Dilated small bowel with obstructive pattern that was also seen on prior abdominal CT and secondary to the pelvic inflammation. 2. Enteric tube tip likely at the DJ junction. Electronically Signed   By: [place] Fantasia M.D.   On: 06/06/2019 10:04   CT IMAGE GUIDED DRAINAGE BY PERCUTANEOUS CATHETER  Result Date: 06/05/2019 INDICATION: 43 year old female status post uncomplicated robotic assisted hysterectomy. Her postoperative course was subsequently complicated by development of multiple intra-abdominal fluid collections. It is  uncertain if these represent areas of hematoma or abscess. The largest collection in the pelvic cul-de-sac has enlarged over serial imaging and she now presents for transgluteal drainage. EXAM: CT-guided drain placement MEDICATIONS: The patient is currently admitted to the hospital and receiving intravenous antibiotics. The antibiotics were administered within an appropriate time frame prior to the initiation of the procedure. ANESTHESIA/SEDATION: Fentanyl 100 mcg IV; Versed 2 mg IV Moderate Sedation Time:  10 minutes The patient was continuously monitored during the procedure by the interventional radiology nurse under my direct supervision. COMPLICATIONS: None immediate. PROCEDURE: Informed written consent was obtained from the patient after a thorough discussion of the procedural risks, benefits and alternatives. All questions were addressed. Maximal Sterile Barrier Technique was utilized including caps, mask, sterile gowns, sterile gloves, sterile drape, hand hygiene and skin antiseptic. A timeout was performed prior to the initiation of the procedure. A planning axial CT scan was performed. The complex fluid collection in the pelvic cul-de-sac was successfully identified. A suitable skin entry site from a right transgluteal approach was selected and marked. The overlying skin was sterilely prepped and draped in the standard fashion using chlorhexidine skin prep. Local anesthesia was attained by infiltration with 1% lidocaine. A small dermatotomy was made. Under intermittent CT guidance, an 18 gauge trocar needle was advanced along a transgluteal, parasacral course into the fluid collection. A 0.035 wire was then coiled in the fluid collection. The needle was removed. The skin tract was dilated to 12 [place]. A Cook 12 [place] all-purpose drainage catheter was then advanced  over the wire and formed. Aspiration yields 15-20 mL of dark bloody fluid. No frank purulence. Samples were sent for Gram stain and culture.  The drain was gently flushed with saline and connected to JP bulb suction. The drain was then secured to the skin with 0 Prolene suture. Follow-up axial CT imaging demonstrates a well-positioned drainage catheter and no evidence of immediate complication. IMPRESSION: 1. Successful placement of a 75 French percutaneous drain via a right transgluteal approach. Aspiration yields a relatively small volume of dark bloody fluid. This appears to represent a liquified hematoma. Although no frank purulence was encountered, a sample of the aspirated blood was sent for Gram stain and culture. PLAN: Maintain drain to JP bulb suction. Flush drain with 10 cc saline once per shift. When drain output is scant (no more than 5-10 mL over the volume of flushes) for 48 hours or longer the drainage catheter can be removed. Signed, Criselda Peaches, MD, [facility] Electronically Signed   By: Jacqulynn Cadet M.D.   On: 06/05/2019 08:26   [place] EKG SITE RITE  Result Date: 06/05/2019 If Site Rite image not attached, placement could not be confirmed due to current cardiac rhythm.   Labs:  CBC: Recent Labs    06/01/19 0228 06/02/19 0401 06/05/19 0128 06/06/19 0449  WBC 15.3* 12.2* 10.1 10.1  HGB 11.3* 9.2* 7.8* 8.0*  HCT 35.1* 28.7* 24.5* 25.7*  PLT 290 300 285 364    COAGS: Recent Labs    06/01/19 0243  INR 1.1  APTT 35    BMP: Recent Labs    06/02/19 0401 06/05/19 0128 06/06/19 0449 06/07/19 0616  NA 142 142 142 141  K 3.7 3.0* 3.6 3.2*  CL 106 103 104 103  CO2 24 25 26 29   GLUCOSE 98 97 140* 122*  BUN 8 6 <5* <5*  CALCIUM 8.0* 7.8* 7.8* 8.0*  CREATININE 0.71 0.74 0.49 0.53  GFRNONAA >60 >60 >60 >60  GFRAA >60 >60 >60 >60    LIVER FUNCTION TESTS: Recent Labs    06/01/19 0228 06/02/19 0401 06/06/19 0449 06/07/19 0616  BILITOT 2.7* 1.6* 1.2 0.5  AST 14* 13* 42* 28  ALT 14 11 22 20   ALKPHOS 81 82 59 53  PROT 8.1 6.0*  5.6* 5.7*  ALBUMIN 3.9 2.6* 2.5* 2.3*    Assessment and Plan: Pt s/p robotic assisted hysterectomy for fibroids [id number] complicated by multiple intraabdominal fluid collections; s/p drainage of largest pelvic cul-de -sac collection on 06/04/19; afebrile; creat nl; K 3.2; drain fluid cx with rare e coli; urine cx neg; ; minimal OP from drain noted- will recheck CT on 12/25 to assess adequacy of drainage/additional collections   Electronically Signed: D. Rowe Robert, PA-C 06/07/2019, 1:56 PM   I spent a total of 15 minutes at the the patient's bedside AND on the patient's hospital floor or unit, greater than 50% of which was counseling/coordinating care for pelvic abscess drainage    Patient ID: Deborah Murphy, female   DOB: 07-16-1975, 43 y.o.   MRN: [id number]

## 2019-06-08 ENCOUNTER — Inpatient Hospital Stay (HOSPITAL_COMMUNITY): Payer: BC Managed Care – PPO

## 2019-06-08 LAB — BASIC METABOLIC PANEL
Anion gap: 7 (ref 5–15)
BUN: <5 mg/dL — ABNORMAL LOW (ref 6–20)
CO2: 26 mmol/L (ref 22–32)
Calcium: 8 mg/dL — ABNORMAL LOW (ref 8.9–10.3)
Chloride: 107 mmol/L (ref 98–111)
Creatinine, Ser: 0.46 mg/dL (ref 0.44–1.00)
GFR calc Af Amer: >60 mL/min (ref >60)
GFR calc non Af Amer: >60 mL/min (ref >60)
Glucose, Bld: 110 mg/dL — ABNORMAL HIGH (ref 70–99)
Potassium: 3.8 mmol/L (ref 3.5–5.1)
Sodium: 140 mmol/L (ref 135–145)

## 2019-06-08 LAB — AEROBIC/ANAEROBIC CULTURE W GRAM STAIN (SURGICAL/DEEP WOUND)

## 2019-06-08 MED ORDER — POLYETHYLENE GLYCOL 3350 17 G PO PACK
17.0000 g | PACK | Freq: Every day | ORAL | Status: DC
  Administered 2019-06-08 – 2019-06-11 (×4): 17 g via ORAL
  Filled 2019-06-08 (×4): qty 1

## 2019-06-08 MED ORDER — IOHEXOL 300 MG/ML  SOLN
100.0000 mL | Freq: Once | INTRAMUSCULAR | Status: AC | PRN
  Administered 2019-06-08: 100 mL via INTRAVENOUS

## 2019-06-08 MED ORDER — FUROSEMIDE 10 MG/ML IJ SOLN
20.0000 mg | Freq: Once | INTRAMUSCULAR | Status: AC
  Administered 2019-06-08: 20 mg via INTRAVENOUS
  Filled 2019-06-08: qty 2

## 2019-06-08 MED ORDER — DOCUSATE SODIUM 100 MG PO CAPS
100.0000 mg | ORAL_CAPSULE | Freq: Two times a day (BID) | ORAL | Status: DC
  Administered 2019-06-08 – 2019-06-11 (×7): 100 mg via ORAL
  Filled 2019-06-08 (×7): qty 1

## 2019-06-08 MED ORDER — TRAVASOL 10 % IV SOLN
INTRAVENOUS | Status: AC
  Filled 2019-06-08: qty 513.6

## 2019-06-08 NOTE — Progress Notes (Signed)
PHARMACY - TOTAL PARENTERAL NUTRITION CONSULT NOTE  Indication: malnutrition/prolonged intolerance to PO intake  Patient Measurements: Height: 5\' 6"  (167.6 cm) Weight: 130 lb (59 kg) IBW/kg (Calculated) : 59.3 TPN AdjBW (KG): 59 Body mass index is 20.98 kg/m.  Assessment:  20 YOF who presented for robotic assisted laparoscopic hysterectomy and BL salpingectomy for fibroids on 12/11. No issues with surgery but presented to ED on 12/18 with worsening nausea, abdominal pain and fever. ED ET scan showed complex fluid and gas collections throughout pelvis and abdomen.   Glucose / Insulin: no hx DM - CBGs controlled. Received 3 units SSI in the last 24 hours Electrolytes: lytes within normal limits.  Renal: SCr 0.46, BUN < 5 LFTs / TGs: LFTs/Tbili and TG within normal limits.  Prealbumin: 5.9 Intake / Output; MIVF: NGT removed, UOP 0.39mL/kg/hr, drain O/P 73mL - LR at 100 ml/hr. LBM 12/24.  GI Imaging: 12/21 CT abd - abscess anterior to rectum w increasing size, smaller abscesses scattered in pelvis. Also SBO vs ileus  12/23 Abd xray: Dilated small bowel with obstructive pattern that was also seen on prior abdominal CT and secondary to the pelvic inflammation Surgeries / Procedures: 12/11 hysterectomy, 12/21 IR drain placement  Central access: PICC placed 06/05/19 TPN start date: 06/05/19  Nutritional Goals (per RD rec 12/24): 1750-1950 kCal, 90-105g protein, ~1.7L fluid per day   Current Nutrition:  TPN Full liquid diet -0% documented but per RN drinking and taking in clears yesterday and tolerated well; 50% intake documented for breakfast  Plan:  Continue reduced TPN at 40 ml/hr  TPN will provide 51g AA, 144g CHO and 30g ILE for a total of 993 kCal, meeting ~57% of patient needs Electrolytes in TPN: increase K, Cl:Ac 1:1 Daily trace elements in TPN Continue prenatal multivitamin PO daily DC SSI KCl 68meq IV Q1H x 4 runs F/U diet and ability to stop TPN soon  Sloan Leiter,  PharmD, BCPS, BCCCP Clinical Pharmacist Please refer to [facility] for [place] numbers 06/08/2019 10:16 AM

## 2019-06-08 NOTE — Progress Notes (Addendum)
Patient is tolerating liquids and moved to full liquids today; ambulating, and voiding.  Pain control is good.  Pt having less bowel movements that are more particulate.  She had a cough yesterday.  She appears overall well. She states has some flatus but not as much as initially.    Vitals:   06/07/19 0643 06/07/19 1516 06/07/19 2244 06/08/19 0642  BP: 137/79 (!) 135/94 (!) 149/88 128/87  Pulse: 91 (!) 103 (!) 101   Resp: 17 16 17 18   Temp: 98.4 F (36.9 C) 98.6 F (37 C) 98.7 F (37.1 C) 98.4 F (36.9 C)  TempSrc: Oral Oral Oral Oral  SpO2: 100% 100% 100% 100%  Weight:      Height:        lungs:   clear to auscultation cor:    RRR Abdomen:  Soft though still distended, appropriate tenderness, incisions intact and without erythema or exudate. ex:    no cords   Lab Results  Component Value Date   WBC 10.1 06/06/2019   HGB 8.0 (L) 06/06/2019   HCT 25.7 (L) 06/06/2019   MCV 97.3 06/06/2019   PLT 364 06/06/2019    CT FINDINGS: Lower chest: Interval development of a patchy airspace opacities in the periphery of the left lower lobe with a small associated pleural effusion. Mild dependent atelectasis in the right lower lobe. The visualized cardiac structures are within normal limits. No pericardial effusion.  Hepatobiliary: No hepatic injury or perihepatic hematoma. Gallbladder is unremarkable  Pancreas: Unremarkable. No pancreatic ductal dilatation or surrounding inflammatory changes.  Spleen: Normal in size without focal abnormality.  Adrenals/Urinary Tract: Interval development of a mild bilateral hydronephrosis. The adrenal glands are unremarkable. The bladder is unremarkable. The right ureter is mildly dilated.  Stomach/Bowel: Significant dilation of small bowel throughout the abdomen with a focal transition in the mid abdomen small bowel is dilated up to 4.6 cm.  Vascular/Lymphatic: No significant vascular findings are present. No enlarged abdominal or  pelvic lymph nodes.  Reproductive: Surgical changes of hysterectomy.  No adnexal masses.  Other: Multiple peripherally enhancing intermediate attenuation fluid collections are again identified in the pelvis and low abdomen in the region of the small bowel mesentery. The collection is somewhat serpentine in configuration and all loculations appear to communicate centrally. This is most well demonstrated on the coronal reformatted images. The collections are more well-defined on today's examination compared to 12/21 but are not substantially different in size. The largest collection within the small bowel mesentery measures 4.4 by 5.0 x 3.3 cm. The percutaneous drainage catheter enters via a right transgluteal approach and is well positioned in the central aspect of the complex fluid collections. The collection in the pelvic cul-de-sac is slightly improved at 4.7 x 2.8 cm compared to 5.2 x 4.5 cm previously. Interval development of mild ascites. Persistent subcutaneous air along the left abdominal wall. Dependent edema is again noted in the subcutaneous fat posteriorly. Additionally, edema now extends along the mons pubis and left lower extremity.  Musculoskeletal: No acute fracture or aggressive appearing lytic or blastic osseous lesion.  IMPRESSION: 1. Persistent and more well-defined peripherally enhancing fluid collection with the epicenter in the surgical bed in the pelvis. The fluid collection is stellate with multiple finger-like loculations which radiate out in the pelvis and up into the small bowel mesentery. The percutaneous drainage catheter remains well positioned within the central aspect of the fluid collection. The portion in the pelvic cul-de-sac is slightly improved compared to 12/21, however the other  loculations are insignificantly changed. As noted at the time of percutaneous drain placement, this likely represents postoperative hematoma. 2. Severe ileus  versus partial small bowel obstruction with a transition point in the mid abdomen presumably related to underlying adhesions. 3. Developing bilateral mild hydronephrosis likely secondary to inflammatory changes impinging upon the ureters in the pelvis. 4. Interval development of patchy airspace opacities in the left lower lobe with an associated left pleural effusion. Findings are concerning for possible left lower lobe pneumonia versus small volume aspiration. 5. Developing ascites, likely reactive and related to the ileus/bowel obstruction. 6. Persistent subcutaneous emphysema and edema along the soft tissues of the left anterolateral abdomen. 7. Dependent anasarca. 8. Asymmetric left lower extremity soft tissue edema. No evidence of DVT in the well visualized iliac and proximal femoral veins.      A/PHD #6, POD #14 Robotic TLH/salpingegectomies.  1. Cultrue of bloody serous fluid with a few gram negative rods- rare Ecoli and waiting to see if anaerobe grows out. Pharm believes that likely will be same bacteria as was in blood culture. Pt now on Unasyn.. Pt remains afebrile. 2. S/p drainage of main fluid collection. JP in place and drained 10cc of bloody fluid yesterday. Continue until output is <5  ml for 48 hours. IR(ttnank you for your help!) ordered CT today and results: drainage of main pocket is continuing but smaller areas remain unchanged; likely postop hematoma that irritated bowel to cause partial small bowel obstruction.  With few bacterial growing out of fluid, likely the antibiotics are sterilizing the areas and expect them to resolve over time. Pt looks and acts much less concerning than the CT- despite continued small bowel distention, she is tolerating po full liquids and has had no nausea or vomiting.  Will hold to full liquids for today. 2b.  Anasarca, pulmonary edema and pedal edema- not from DVT per IR.  Likely from fluid overload.  Will do Lasix x1 dose to see  if this mobilizes. Pt c/o cough recently.   2c. Possible pneumonia- unlikely aspiration since pt has not been vomiting since admission.  On Unasyn which should take care of any bacterial issues.   2d.  Vaginal spotting likely from interruption of fluid collection in cul de sac. Vagina was  intact and no active bleeding. No discharge like yeast either.  3. General surgery following and helping with care of patient (thank you!)  4. Started lovenox now all procedures are done. 5.  UA showed no LE and <5 WBC. 6.  TPN continuing until pt tolerating regular diet. 7. Per General Surgery, NG tube out ast Xray showed some resolution.Will ask them to look at CT from today.  8.  Will consult discharge planning to set up home health for antibiotics to continue for a total of 10 days if d/ced before that time.

## 2019-06-08 NOTE — TOC Initial Note (Signed)
Transition of Care [facility]) - Initial/Assessment Note    Patient Details  Name: Deborah Murphy MRN: [id number] Date of Birth: 1975/11/23  [facility] CM/SW Contact:    Maryclare Labrador, RN Phone Number: 06/08/2019, 11:16 AM  Clinical Narrative:     CM contacted by attending - pt will need IV antibiotics at discharge.  CM provided choice for home infusion company and [facility].  CM exhausted medicare.gov HH list for [facility].  Pt chose [facility] and in agreement for [facility].  [facility] will be contacted directly by [facility] for referral.  Pt informed CM that she has two daughters in the home 15 and 86  - both can be trained for assistance with home IV infusions.                Expected Discharge Plan: [place]     Patient Goals and CMS Choice   CMS Medicare.gov Compare Post Acute Care list provided to:: Patient Choice offered to / list presented to : Patient  Expected Discharge Plan and Services Expected Discharge Plan: [place]       Living arrangements for the past 2 months: Single Family Home                 DME Arranged: IV pump/equipment DME Agency: Other - Comment([facility]) Date DME Agency Contacted: 06/08/19 Time DME Agency Contacted: 1114   [place] Arranged: RN [place] Agency: Personnel officer HH)     Representative spoke with at [place]: [facility] will handle referral for [facility] directly with [facility]  Prior Living Arrangements/Services Living arrangements for the past 2 months: Single Family Home Lives with:: Minor Children Patient language and need for interpreter reviewed:: No Do you feel safe going back to the place where you live?: Yes      Need for Family Participation in Patient Care: Yes (Comment) Care giver support system in place?: Yes (comment)   Criminal Activity/Legal Involvement Pertinent to Current Situation/Hospitalization: No - Comment as needed  Activities of Daily Living Home Assistive Devices/Equipment:  Eyeglasses ADL Screening (condition at time of admission) Patient's cognitive ability adequate to safely complete daily activities?: Yes Is the patient deaf or have difficulty hearing?: No Does the patient have difficulty seeing, even when wearing glasses/contacts?: No Does the patient have difficulty concentrating, remembering, or making decisions?: No Patient able to express need for assistance with ADLs?: Yes Does the patient have difficulty dressing or bathing?: No Independently performs ADLs?: Yes (appropriate for developmental age) Does the patient have difficulty walking or climbing stairs?: No Weakness of Legs: None Weakness of Arms/Hands: None  Permission Sought/Granted   Permission granted to share information with : Yes, Verbal Permission Granted     Permission granted to share info w AGENCY: [facility] and [facility]        Emotional Assessment   Attitude/Demeanor/Rapport: Engaged, Gracious Affect (typically observed): Accepting, Adaptable Orientation: : Oriented to Self, Oriented to Place, Oriented to  Time, Oriented to Situation      Admission diagnosis:  SBO (small bowel obstruction) ([place]) [K56.609] Encounter for nasogastric (NG) tube placement [Z46.59] Post-operative infection [T81.40XA] Postprocedural intraabdominal abscess [T81.43XA] Sepsis following procedure, initial encounter [facility]) [T81.44XA] Patient Active Problem List   Diagnosis Date Noted  . Post-operative infection 06/01/2019  . Postoperative state 05/25/2019  . 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 Graves disease 06/11/2018   PCP:  Seward Carol, MD Pharmacy:   [facility], [place] [address] Phone: [phone] Fax: [place], [place] [address] [place] [address] Phone: [phone] Fax: [phone]     Social  Determinants of Health (SDOH) Interventions    Readmission Risk Interventions No flowsheet data found.

## 2019-06-09 LAB — COMPREHENSIVE METABOLIC PANEL
ALT: 28 U/L (ref 0–44)
AST: 45 U/L — ABNORMAL HIGH (ref 15–41)
Albumin: 2.4 g/dL — ABNORMAL LOW (ref 3.5–5.0)
Alkaline Phosphatase: 55 U/L (ref 38–126)
Anion gap: 7 (ref 5–15)
BUN: <5 mg/dL — ABNORMAL LOW (ref 6–20)
CO2: 28 mmol/L (ref 22–32)
Calcium: 8.2 mg/dL — ABNORMAL LOW (ref 8.9–10.3)
Chloride: 106 mmol/L (ref 98–111)
Creatinine, Ser: 0.52 mg/dL (ref 0.44–1.00)
GFR calc Af Amer: >60 mL/min (ref >60)
GFR calc non Af Amer: >60 mL/min (ref >60)
Glucose, Bld: 114 mg/dL — ABNORMAL HIGH (ref 70–99)
Potassium: 4.1 mmol/L (ref 3.5–5.1)
Sodium: 141 mmol/L (ref 135–145)
Total Bilirubin: 0.8 mg/dL (ref 0.3–1.2)
Total Protein: 5.4 g/dL — ABNORMAL LOW (ref 6.5–8.1)

## 2019-06-09 LAB — PREPARE RBC (CROSSMATCH)

## 2019-06-09 LAB — CBC WITH DIFFERENTIAL/PLATELET
Abs Immature Granulocytes: 0.11 10*3/uL — ABNORMAL HIGH (ref 0.00–0.07)
Basophils Absolute: 0 10*3/uL (ref 0.0–0.1)
Basophils Relative: 0 %
Eosinophils Absolute: 0.1 10*3/uL (ref 0.0–0.5)
Eosinophils Relative: 1 %
HCT: 22.7 % — ABNORMAL LOW (ref 36.0–46.0)
Hemoglobin: 7 g/dL — ABNORMAL LOW (ref 12.0–15.0)
Immature Granulocytes: 1 %
Lymphocytes Relative: 14 %
Lymphs Abs: 1.2 10*3/uL (ref 0.7–4.0)
MCH: 30.8 pg (ref 26.0–34.0)
MCHC: 30.8 g/dL (ref 30.0–36.0)
MCV: 100 fL (ref 80.0–100.0)
Monocytes Absolute: 0.5 10*3/uL (ref 0.1–1.0)
Monocytes Relative: 6 %
Neutro Abs: 6.7 10*3/uL (ref 1.7–7.7)
Neutrophils Relative %: 78 %
Platelets: 341 10*3/uL (ref 150–400)
RBC: 2.27 MIL/uL — ABNORMAL LOW (ref 3.87–5.11)
RDW: 13.1 % (ref 11.5–15.5)
WBC: 8.6 10*3/uL (ref 4.0–10.5)
nRBC: 0.3 % — ABNORMAL HIGH (ref 0.0–0.2)

## 2019-06-09 LAB — ABO/RH: ABO/RH(D): A POS

## 2019-06-09 LAB — MAGNESIUM: Magnesium: 2 mg/dL (ref 1.7–2.4)

## 2019-06-09 MED ORDER — FUROSEMIDE 10 MG/ML IJ SOLN
20.0000 mg | Freq: Once | INTRAMUSCULAR | Status: DC
  Filled 2019-06-09: qty 2

## 2019-06-09 MED ORDER — SODIUM CHLORIDE 0.9% IV SOLUTION: Freq: Once | INTRAVENOUS | Status: DC

## 2019-06-09 MED ORDER — ACETAMINOPHEN 325 MG PO TABS
650.0000 mg | ORAL_TABLET | Freq: Once | ORAL | Status: AC
  Administered 2019-06-09: 650 mg via ORAL
  Filled 2019-06-09: qty 2

## 2019-06-09 MED ORDER — DIPHENHYDRAMINE HCL 25 MG PO CAPS
25.0000 mg | ORAL_CAPSULE | Freq: Once | ORAL | Status: AC
  Administered 2019-06-09: 25 mg via ORAL
  Filled 2019-06-09: qty 1

## 2019-06-09 NOTE — Progress Notes (Signed)
PHARMACY - TOTAL PARENTERAL NUTRITION CONSULT NOTE  Indication: malnutrition/prolonged intolerance to PO intake  Patient Measurements: Height: 5\' 6"  (167.6 cm) Weight: 130 lb (59 kg) IBW/kg (Calculated) : 59.3 TPN AdjBW (KG): 59 Body mass index is 20.98 kg/m.  Assessment:  6 YOF who presented for robotic assisted laparoscopic hysterectomy and BL salpingectomy for fibroids on 12/11. No issues with surgery but presented to ED on 12/18 with worsening nausea, abdominal pain and fever. ED ET scan showed complex fluid and gas collections throughout pelvis and abdomen.   Glucose / Insulin: no hx DM - CBGs controlled. Received 3 units SSI in the last 24 hours Electrolytes: lytes within normal limits.  Renal: SCr 0.52, BUN < 5 LFTs / TGs: LFTs/Tbili and TG within normal limits.  Prealbumin: 5.9 Intake / Output; MIVF: NGT removed, UOP 0.67mL/kg/hr, drain O/P 77mL - LR at 100 ml/hr. LBM 12/24.  GI Imaging: 12/21 CT abd - abscess anterior to rectum w increasing size, smaller abscesses scattered in pelvis. Also SBO vs ileus  12/23 Abd xray: Dilated small bowel with obstructive pattern that was also seen on prior abdominal CT and secondary to the pelvic inflammation Surgeries / Procedures: 12/11 hysterectomy, 12/21 IR drain placement  Central access: PICC placed 06/05/19 TPN start date: 06/05/19  Nutritional Goals (per RD rec 12/24): 1750-1950 kCal, 90-105g protein, ~1.7L fluid per day   Current Nutrition:  TPN Full liquid >> Advancing to Soft  Plan:  Discussed with Dr. Philis Pique -patient advancing to soft diet. OK to stop TPN. OK to finish current TPN bag which is at half rate then discontinue when complete.  Will discontinue further TPN orders.  Continue prenatal multivitamin PO daily  Sloan Leiter, PharmD, BCPS, BCCCP Clinical Pharmacist Please refer to [facility] for [place] numbers 06/09/2019 9:27 AM

## 2019-06-09 NOTE — Progress Notes (Signed)
Patient is tolerating full liquids, ambulating, and voiding.  Pain control is good.  She passes gas when getting up to bathroom.  Bowel movements continue and are still watery with particulate matter.  No nausea or vomiting.  Cough is still there but better.  Pt has complained of headache for several days.  Not being relieved with tylenol or ibuprofen.  We discussed that although her H/H is relatively stable, she is anemic and may be experiencing headaches because of that.  I have consented her for blood transfusion.   Vitals:   06/08/19 1444 06/08/19 2048 06/09/19 0400 06/09/19 0406  BP: (!) 136/94 (!) 145/97 (!) 144/98 (!) 144/100  Pulse: 96 (!) 104 (!) 107 96  Resp: 17 18 18 18   Temp: 98.8 F (37.1 C) 99.3 F (37.4 C) 99.7 F (37.6 C) 97.8 F (36.6 C)  TempSrc: Oral Oral  Oral  SpO2: 100% 100% 100% 100%  Weight:      Height:        lungs: clear to auscultation cor:    RRR Abdomen: soft, appropriate tenderness, incisions intact and without erythema or exudate. ex:    no cords   Results for orders placed or performed during the hospital encounter of 06/01/19 (from the past 24 hour(s))  Comprehensive metabolic panel     Status: Abnormal   Collection Time: 06/09/19  3:27 AM  Result Value Ref Range   Sodium 141 135 - 145 mmol/L   Potassium 4.1 3.5 - 5.1 mmol/L   Chloride 106 98 - 111 mmol/L   CO2 28 22 - 32 mmol/L   Glucose, Bld 114 (H) 70 - 99 mg/dL   BUN <5 (L) 6 - 20 mg/dL   Creatinine, Ser 0.52 0.44 - 1.00 mg/dL   Calcium 8.2 (L) 8.9 - 10.3 mg/dL   Total Protein 5.4 (L) 6.5 - 8.1 g/dL   Albumin 2.4 (L) 3.5 - 5.0 g/dL   AST 45 (H) 15 - 41 U/L   ALT 28 0 - 44 U/L   Alkaline Phosphatase 55 38 - 126 U/L   Total Bilirubin 0.8 0.3 - 1.2 mg/dL   GFR calc non Af Amer >60 >60 mL/min   GFR calc Af Amer >60 >60 mL/min   Anion gap 7 5 - 15  CBC with Differential/Platelet     Status: Abnormal   Collection Time: 06/09/19  3:27 AM  Result Value Ref Range   WBC 8.6 4.0 - 10.5 K/uL    RBC 2.27 (L) 3.87 - 5.11 MIL/uL   Hemoglobin 7.0 (L) 12.0 - 15.0 g/dL   HCT 22.7 (L) 36.0 - 46.0 %   MCV 100.0 80.0 - 100.0 fL   MCH 30.8 26.0 - 34.0 pg   MCHC 30.8 30.0 - 36.0 g/dL   RDW 13.1 11.5 - 15.5 %   Platelets 341 150 - 400 K/uL   nRBC 0.3 (H) 0.0 - 0.2 %   Neutrophils Relative % 78 %   Neutro Abs 6.7 1.7 - 7.7 K/uL   Lymphocytes Relative 14 %   Lymphs Abs 1.2 0.7 - 4.0 K/uL   Monocytes Relative 6 %   Monocytes Absolute 0.5 0.1 - 1.0 K/uL   Eosinophils Relative 1 %   Eosinophils Absolute 0.1 0.0 - 0.5 K/uL   Basophils Relative 0 %   Basophils Absolute 0.0 0.0 - 0.1 K/uL   Immature Granulocytes 1 %   Abs Immature Granulocytes 0.11 (H) 0.00 - 0.07 K/uL  Magnesium     Status: None  Collection Time: 06/09/19  3:27 AM  Result Value Ref Range   Magnesium 2.0 1.7 - 2.4 mg/dL    A/P  HD #7, POD #15 Robotic TLH/salpingectomies. Pelvic and abdominal abscesses.     1.  Culture of pelvic abscess shows few E coli, moderate bacteroides fragilis.  On Unasyn now, will be on 7 days of antibiotics today.  Will need 10 days total. 2.  Pt tolerated full liquids for 24 hours.  Now advancing to soft diet.  TPN to be stopped. 3.  Lasix given yesterday for anasarca and some pulmonary edema.  Cough is better. 4.  Electrolytes are stable.  H/H is relatively stable- has been hovering around 7.5.  Pt has been getting iron in TPN but I feel that she is symptomatic with the headache.  Will transfuse 1 unit PRBCs.  Lasix after that.  5.  Possible pneumonia should be covered with antibiotics. 6.  General surgery felt CT was c/w ileus still.  Recommended slow return to po- pt is doing well with soft diet. On stool softner and laxative to help ileus.   7.  D/c planning for antibiotics at home has been initiated. Hope to d/c pt on Monday.  8.  JP in place.  Yesterday drained 50 ccs- stay in place until <5cc for 48 hours.

## 2019-06-10 LAB — BPAM RBC
Blood Product Expiration Date: 202101262359
ISSUE DATE / TIME: 202012261502
Unit Type and Rh: 6200

## 2019-06-10 LAB — CBC
HCT: 27.3 % — ABNORMAL LOW (ref 36.0–46.0)
Hemoglobin: 8.6 g/dL — ABNORMAL LOW (ref 12.0–15.0)
MCH: 31 pg (ref 26.0–34.0)
MCHC: 31.5 g/dL (ref 30.0–36.0)
MCV: 98.6 fL (ref 80.0–100.0)
Platelets: 307 10*3/uL (ref 150–400)
RBC: 2.77 MIL/uL — ABNORMAL LOW (ref 3.87–5.11)
RDW: 13.8 % (ref 11.5–15.5)
WBC: 8.3 10*3/uL (ref 4.0–10.5)
nRBC: 0 % (ref 0.0–0.2)

## 2019-06-10 LAB — TYPE AND SCREEN
ABO/RH(D): A POS
Antibody Screen: NEGATIVE
Unit division: 0

## 2019-06-10 IMAGING — CR CHEST - 2 VIEW
2 series · 2 of 2 positions shown · non-contrast
Comparison: 11/10/2007

CLINICAL DATA: Cough

EXAM:
CHEST - 2 VIEW

[w chest pa]
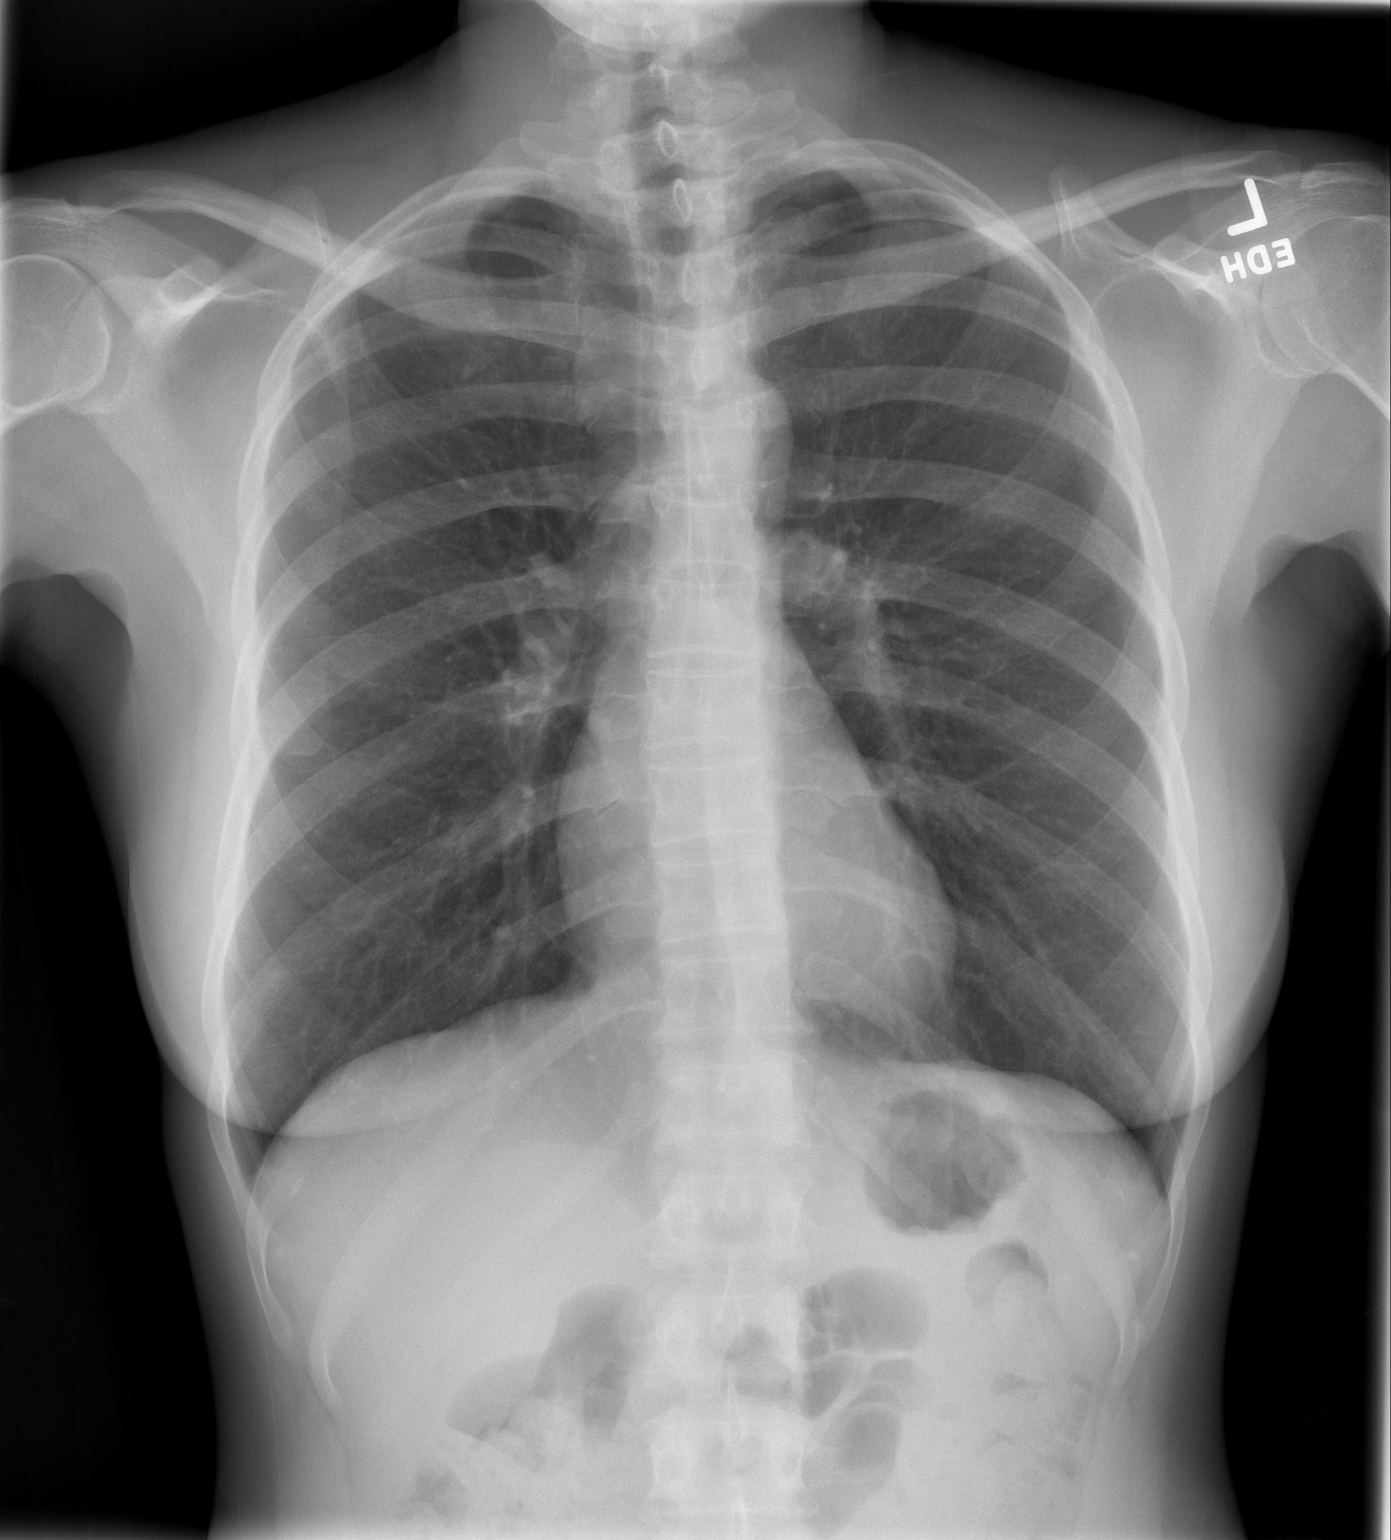

[w chest lat]
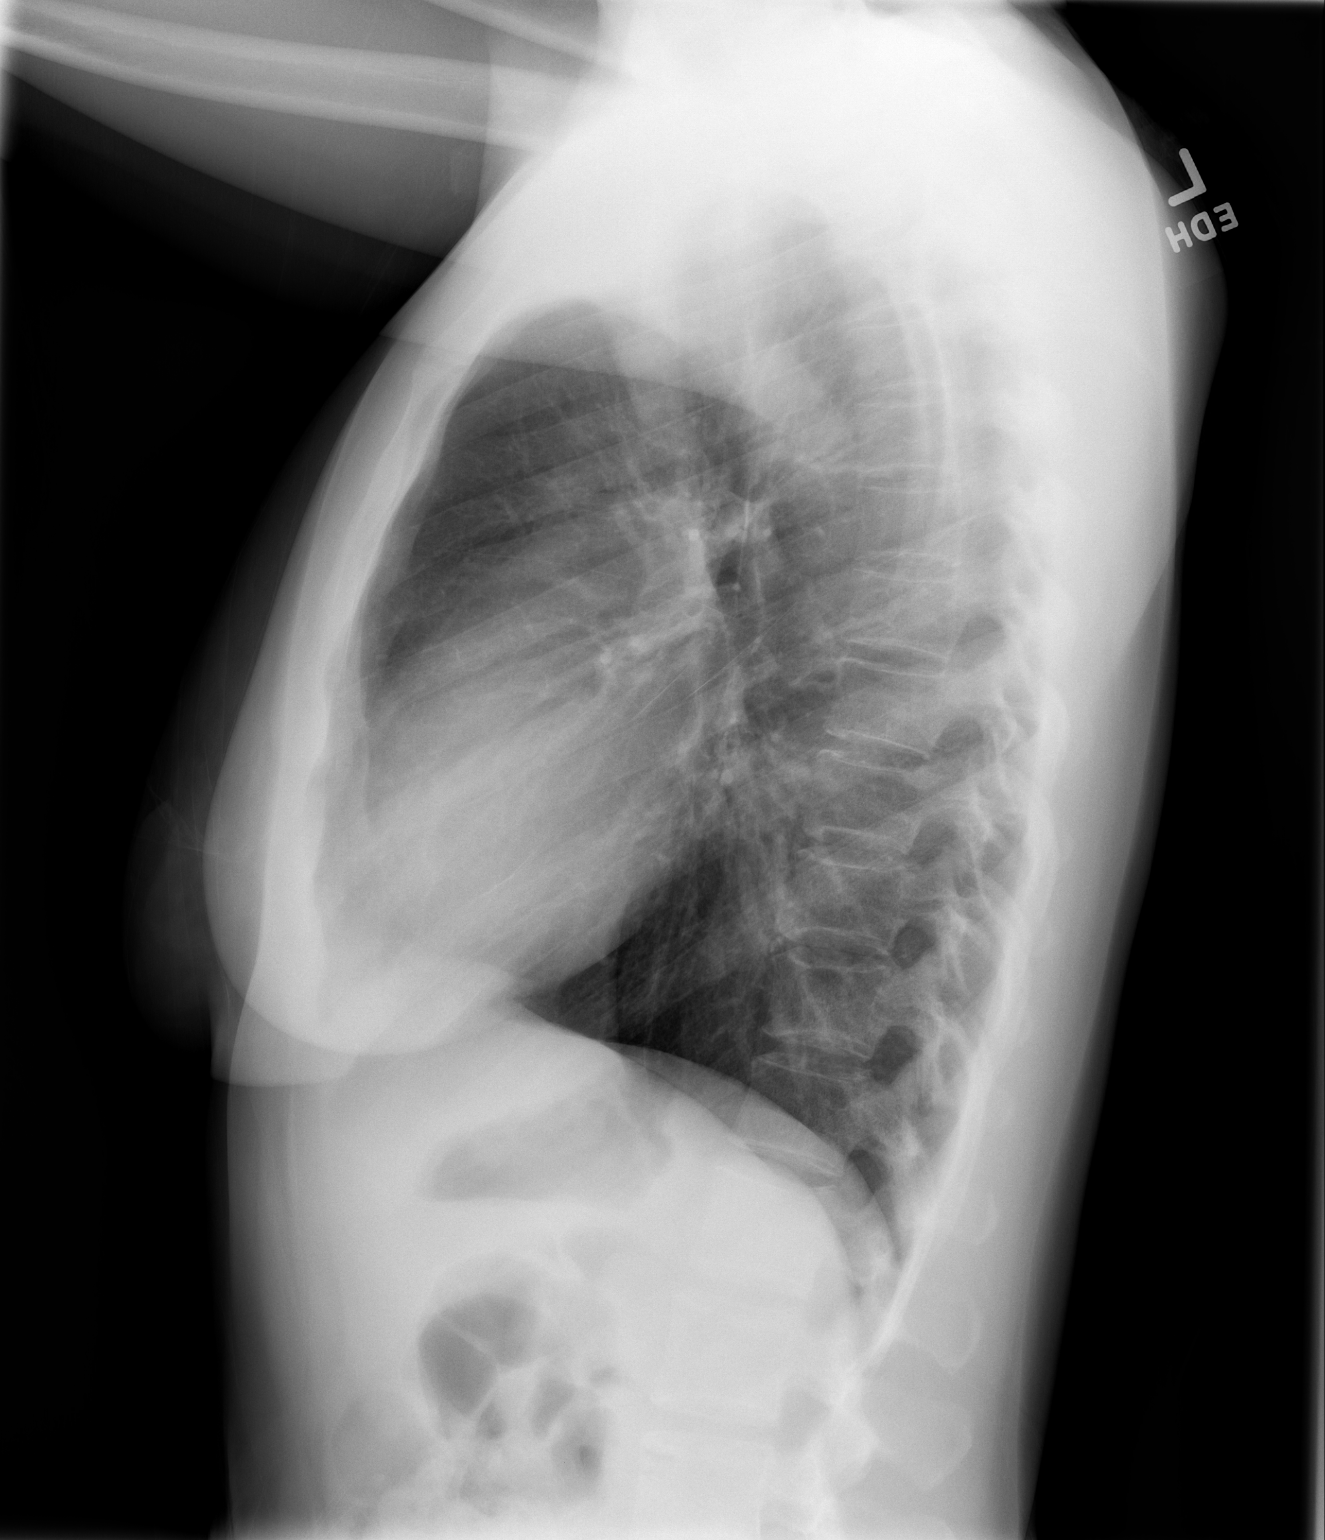

[2 of 2 positions shown; findings below may reference images not displayed]

FINDINGS: The heart size and mediastinal contours are within normal limits.
Both lungs are clear. The visualized skeletal structures are
unremarkable.
IMPRESSION: No active cardiopulmonary disease.

## 2019-06-10 MED ORDER — BENZONATATE 100 MG PO CAPS: 100.0000 mg | ORAL_CAPSULE | Freq: Three times a day (TID) | ORAL | Status: DC | PRN

## 2019-06-10 MED ORDER — FUROSEMIDE 10 MG/ML IJ SOLN
20.0000 mg | Freq: Once | INTRAMUSCULAR | Status: AC
  Administered 2019-06-10: 20 mg via INTRAVENOUS
  Filled 2019-06-10: qty 2

## 2019-06-10 NOTE — Progress Notes (Addendum)
Patient is eating, ambulating, and voiding.  Pain control is good.  Pt had unit of blood yesterday- today headache is better.  BPs have been elevated and may also be contributing to HAs- probably from fluid overload.  Cough was back yesterday but pt thinks this may be from methimazole.  Tesslon pearls help- will order.  Vitals:   06/09/19 1946 06/09/19 2110 06/10/19 0528 06/10/19 0529  BP: (!) 150/99 (!) 141/93 136/90 136/90  Pulse: 93 90  97  Resp: 16 14 16 16   Temp: 98.7 F (37.1 C) 98.9 F (37.2 C) 98.8 F (37.1 C) 98.8 F (37.1 C)  TempSrc:  Oral  Oral  SpO2: 100% 100% 99% 100%  Weight:      Height:        lungs:  clear to auscultation cor:    RRR Abdomen:  Softer with good bowels sounds, appropriate tenderness, incisions intact and without erythema or exudate. ex:    no cords   Lab Results  Component Value Date   WBC 8.3 06/10/2019   HGB 8.6 (L) 06/10/2019   HCT 27.3 (L) 06/10/2019   MCV 98.6 06/10/2019   PLT 307 06/10/2019   A/P HD #8, POD #16 Robotic TLH/Salpingectomies.  Pelvic and abdominal abscesses.  1.  On Unasyn, Day 8 of antibiotics today.  Afebrile since admission.  WBC down to 8.3.  Plan on 10 days total.  If pt able to be d/ced tomorrow, Home Health will be ordered for the last 2 days.  2.  Pt tolerating soft diet for 24 hours.  TPN stopped. Pt desires to try full diet today.  Not eating a lot but no nausea or vomiting.   3.  Pt received one unit of blood yesterday and H/H now up to  8.6/27.  Headache is bett.er, pt feels less tired but just woke up.  4.  Lasix given yesterday post transfusion. Will do another dose today to help with anasarca and possibly help with BP.    5.  Possible pneumonia on CT- cough is better today but was significant yesterday  Antibiotics coverage should take care of this.  Tesslon pearls for cough.   6.  JP still in place.  Last 24 hours was 30 cc.  Will need to keep in until <5 cc for 48 hours. Pt will need measuring device for  home and I will contact IR tomorrow to see if needs to see them for d/c or if I can do it in office.  Pt will see me weekly with JP in place.   Daria Pastures

## 2019-06-11 MED ORDER — PRENATAL MULTIVITAMIN CH: 1.0000 | ORAL_TABLET | Freq: Every day | ORAL | 4 refills | Status: AC

## 2019-06-11 MED ORDER — DOCUSATE SODIUM 100 MG PO CAPS: 100.0000 mg | ORAL_CAPSULE | Freq: Two times a day (BID) | ORAL | 0 refills | Status: AC

## 2019-06-11 MED ORDER — IBUPROFEN 800 MG PO TABS: 800.0000 mg | ORAL_TABLET | Freq: Three times a day (TID) | ORAL | 0 refills | Status: AC | PRN

## 2019-06-11 NOTE — Progress Notes (Signed)
Patient discharged to home. Verbalizes understanding of all discharge instructions including incision care, discharge medications, and follow up MD visits. Reviewed JP drain care with patient prior to discharge.

## 2019-06-11 NOTE — Discharge Summary (Signed)
Physician Discharge Summary  Patient ID: Deborah Murphy MRN: [id number] DOB/AGE: [birth year] 43 y.o.  Admit date: 06/01/2019 Discharge date: 06/11/2019  Admission Diagnoses:Postoperative sepsis, abdominal and pelvic abscesses.  Ileus.  UTI.    Discharge Diagnoses: same Active Problems:   Post-operative infection   Discharged Condition: good  Hospital Course: Pt admitted on 06-01-19 for acute fever and suspected ileus.  Pt's CT showed multiple abscesses in abdomen and pelvis.  Blood cultures on admission grew out anaerobic Bacteroides fragilis.  Pt was started on Zosyn for broad spectrum coverage but when bacteria id'ed, pharmacy desires to change to Unasyn.  An NG was placed for ileus or partial small bowel obstruction on admission.  Pt had an enterococcus UTI that was covered by her antibiotics.  Initially the fluid collections were too small to be drained but CT was repeated on 06-04-19 and a dominant fluid collection was seen in cul de sac.  IR was consulted and was able to drain this with a transgluteal perc and JP.  Fluid was bloody and sent for culture and grew out few E coli and Bacteroides fragilis.  The other collections were still present but small.  General Surgery was consulted to provide any additional assistance and they helped with TPN ordering and instructions for maintenance of electrolytes.  They believed CT was consistent with ileus and did not feel that she had a surgical problem.    A PICC line was placed and TPN, fluids and antibitotics were continued.  Xrays of abdomen helped General Surgery to decide to pull NG and was removed on 12-24.  Pt did well with NG out despite f/u CT still showing ileus and multiple small fluid collections still present.  Pt had possible pneumonia seen on CT and fluid overload with anasarca, ascites and some pulmonary edema.  She received 4 doses of lasix over 4 days to help with fluid shifts.  Pt had a cough but was relieved with Tesslon pearls and  pt's SAO2 and lungs were normal.  Pt had 24 hours of clears without problems.  Her stool was firming up and she was passing gas.    On 06-09-19 she was tolerating full liquids but complaining of a persistent headache.  Her H/H drifted down to 7 and I consented her for 1 unit PRBCs for symptomatic anemia.  Her headache improved a little but she did feel better over all.  On 06-11-19 pt had been tolerating regular diet, was off TPN and fluids and completed 10 days of antibiotics.  She had been afebrile since admission.  Her JP drain was still producting more than 5 cc/48 hours and she was d/ced to home with the drain in place.        Consults: general surgery and interventional radiology  Significant Diagnostic Studies: microbiology: blood culture: positive for Bacteroides fragilis and wound culture: positive for E coli and Bacteroides fragilis  and radiology: CT scan: see above  Treatments: IV hydration, antibiotics: Zosyn and Unasyn, analgesia: Dilaudid, TPN, and procedures: PICC line, percutaneous drainage catheter placement, and blood transfusion  Discharge Exam: Blood pressure 129/85, pulse 89, temperature 98.6 F (37 C), temperature source Oral, resp. rate 16, height 5\' 6"  (1.676 m), weight 59 kg, SpO2 100 %.   Disposition: Discharge disposition: 01-Home or Self Care       Discharge Instructions     Call MD for:  persistant nausea and vomiting   Complete by: As directed    Call MD for:  severe uncontrolled pain  Complete by: As directed    Call MD for:  temperature >100.4   Complete by: As directed    Diet - low sodium heart healthy   Complete by: As directed    Discharge instructions   Complete by: As directed    No driving on narcotics, no sexual activity for 2 weeks.   Increase activity slowly   Complete by: As directed    May shower / Bathe   Complete by: As directed    Shower, no bath for 2 weeks.   PICC line removal   Complete by: As directed    Remove  dressing in 24 hours   Complete by: As directed    Sexual Activity Restrictions   Complete by: As directed    No sexual activity for 2 weeks.      Allergies as of 06/11/2019   No Known Allergies      Medication List     TAKE these medications    albuterol 108 (90 Base) MCG/ACT inhaler Commonly known as: VENTOLIN HFA Inhale 2 puffs into the lungs every 6 (six) hours as needed for wheezing or shortness of breath.   benzonatate 200 MG capsule Commonly known as: TESSALON Take 1 capsule (200 mg total) by mouth 3 (three) times daily as needed for cough.   docusate sodium 100 MG capsule Commonly known as: COLACE Take 1 capsule (100 mg total) by mouth 2 (two) times daily.   esomeprazole 20 MG capsule Commonly known as: NEXIUM Take 20 mg by mouth every evening.   ibuprofen 800 MG tablet Commonly known as: ADVIL Take 1 tablet (800 mg total) by mouth every 8 (eight) hours as needed (mild pain).   methimazole 5 MG tablet Commonly known as: TAPAZOLE Take 0.5 tablets (2.5 mg total) by mouth daily.   oxyCODONE-acetaminophen 5-325 MG tablet Commonly known as: PERCOCET/ROXICET Take 1-2 tablets by mouth every 4 (four) hours as needed for severe pain. What changed:  how much to take when to take this   prenatal multivitamin Tabs tablet Take 1 tablet by mouth daily at 12 noon.   triamcinolone ointment 0.1 % Commonly known as: KENALOG Apply 1 application topically daily as needed for irritation.       Follow-up Information     Jacqulynn Cadet, MD Follow up.   Specialties: Interventional Radiology, Radiology Why: follow up with Dr Lewayne Bunting in 10 days--- call [phone] if any questions; flush drain daily 5 cc sterile saline; record Output Contact information: [address] [place] [address] [phone]         [facility] Follow up.   Why: Phone: [phone]  [id number]  Home IV infusion        Bobbye Charleston, MD Follow  up in 4 day(s).   Specialty: Obstetrics and Gynecology Why: Friday at 2 pm. Contact information: [address] [place] [address] [phone]            Signed: Daria Pastures 06/11/2019, 8:32 AM

## 2019-06-11 NOTE — Progress Notes (Signed)
Referring Physician(s): Bobbye Charleston  Supervising Physician: Arne Cleveland  Patient Status:  [facility] - In-pt  Chief Complaint: None  Subjective:  History of robotic assisted laparoscopic hysterectomy and bilateral salpingectomy in OR 05/25/2019 by Dr. Philis Pique, with subsequent development of pelvic fluid collections s/p right TG drain placement in IR 06/04/2019 by Dr. Laurence Ferrari. Patient awake and alert sitting in bed with no complaints at this time. Right TG drain site c/d/i.   Allergies: Patient has no known allergies.  Medications: Prior to Admission medications   Medication Sig Start Date End Date Taking? Authorizing Provider  albuterol (VENTOLIN HFA) 108 (90 Base) MCG/ACT inhaler Inhale 2 puffs into the lungs every 6 (six) hours as needed for wheezing or shortness of breath.   Yes [provider]  esomeprazole (NEXIUM) 20 MG capsule Take 20 mg by mouth every evening.   Yes [provider]  methimazole (TAPAZOLE) 5 MG tablet Take 0.5 tablets (2.5 mg total) by mouth daily. 09/18/18  Yes Shamleffer, Melanie Crazier, MD  oxyCODONE-acetaminophen (PERCOCET/ROXICET) 5-325 MG tablet Take 1-2 tablets by mouth every 4 (four) hours as needed for severe pain. Patient taking differently: Take 1 tablet by mouth 2 (two) times daily as needed for severe pain.  05/25/19  Yes Bobbye Charleston, MD  triamcinolone ointment (KENALOG) 0.1 % Apply 1 application topically daily as needed for irritation. 02/06/19  Yes [provider]  benzonatate (TESSALON) 200 MG capsule Take 1 capsule (200 mg total) by mouth 3 (three) times daily as needed for cough. Patient not taking: Reported on 06/01/2019 01/23/19   Collene Gobble, MD  docusate sodium (COLACE) 100 MG capsule Take 1 capsule (100 mg total) by mouth 2 (two) times daily. 06/11/19   Bobbye Charleston, MD  ibuprofen (ADVIL) 800 MG tablet Take 1 tablet (800 mg total) by mouth every 8 (eight) hours as needed (mild pain).  06/11/19   Bobbye Charleston, MD  Prenatal Vit-Fe Fumarate-FA (PRENATAL MULTIVITAMIN) TABS tablet Take 1 tablet by mouth daily at 12 noon. 06/11/19   Bobbye Charleston, MD     Vital Signs: BP 129/85   Pulse 89   Temp 98.6 F (37 C) (Oral)   Resp 16   Ht 5\' 6"  (1.676 m)   Wt 130 lb (59 kg)   LMP  (LMP Unknown)   SpO2 100%   BMI 20.98 kg/m   Physical Exam Vitals and nursing note reviewed.  Constitutional:      General: She is not in acute distress.    Appearance: Normal appearance.  Pulmonary:     Effort: Pulmonary effort is normal. No respiratory distress.  Abdominal:     Comments: Right TG drain site without tenderness, erythema, drainage, or active bleeding; approximately 10-15 cc thin tan fluid in suction bulb.  Skin:    General: Skin is warm and dry.  Neurological:     Mental Status: She is alert and oriented to person, place, and time.  Psychiatric:        Mood and Affect: Mood normal.        Behavior: Behavior normal.     Imaging: CT ABDOMEN PELVIS W CONTRAST  Result Date: 06/08/2019 CLINICAL DATA:  43 year old female status post robotic assisted hysterectomy complicated by intra-abdominal fluid collections. She underwent placement of a percutaneous drain on 06/04/2019. EXAM: CT ABDOMEN AND PELVIS WITH CONTRAST TECHNIQUE: Multidetector CT imaging of the abdomen and pelvis was performed using the standard protocol following bolus administration of intravenous contrast. CONTRAST:  179mL OMNIPAQUE IOHEXOL 300  MG/ML  SOLN COMPARISON:  Prior CT abdomen/pelvis 06/04/2019 FINDINGS: Lower chest: Interval development of a patchy airspace opacities in the periphery of the left lower lobe with a small associated pleural effusion. Mild dependent atelectasis in the right lower lobe. The visualized cardiac structures are within normal limits. No pericardial effusion. Hepatobiliary: No hepatic injury or perihepatic hematoma. Gallbladder is unremarkable Pancreas: Unremarkable. No  pancreatic ductal dilatation or surrounding inflammatory changes. Spleen: Normal in size without focal abnormality. Adrenals/Urinary Tract: Interval development of a mild bilateral hydronephrosis. The adrenal glands are unremarkable. The bladder is unremarkable. The right ureter is mildly dilated. Stomach/Bowel: Significant dilation of small bowel throughout the abdomen with a focal transition in the mid abdomen small bowel is dilated up to 4.6 cm. Vascular/Lymphatic: No significant vascular findings are present. No enlarged abdominal or pelvic lymph nodes. Reproductive: Surgical changes of hysterectomy.  No adnexal masses. Other: Multiple peripherally enhancing intermediate attenuation fluid collections are again identified in the pelvis and low abdomen in the region of the small bowel mesentery. The collection is somewhat serpentine in configuration and all loculations appear to communicate centrally. This is most well demonstrated on the coronal reformatted images. The collections are more well-defined on today's examination compared to 12/21 but are not substantially different in size. The largest collection within the small bowel mesentery measures 4.4 by 5.0 x 3.3 cm. The percutaneous drainage catheter enters via a right transgluteal approach and is well positioned in the central aspect of the complex fluid collections. The collection in the pelvic cul-de-sac is slightly improved at 4.7 x 2.8 cm compared to 5.2 x 4.5 cm previously. Interval development of mild ascites. Persistent subcutaneous air along the left abdominal wall. Dependent edema is again noted in the subcutaneous fat posteriorly. Additionally, edema now extends along the mons pubis and left lower extremity. Musculoskeletal: No acute fracture or aggressive appearing lytic or blastic osseous lesion. IMPRESSION: 1. Persistent and more well-defined peripherally enhancing fluid collection with the epicenter in the surgical bed in the pelvis. The fluid  collection is stellate with multiple finger-like loculations which radiate out in the pelvis and up into the small bowel mesentery. The percutaneous drainage catheter remains well positioned within the central aspect of the fluid collection. The portion in the pelvic cul-de-sac is slightly improved compared to 12/21, however the other loculations are insignificantly changed. As noted at the time of percutaneous drain placement, this likely represents postoperative hematoma. 2. Severe ileus versus partial small bowel obstruction with a transition point in the mid abdomen presumably related to underlying adhesions. 3. Developing bilateral mild hydronephrosis likely secondary to inflammatory changes impinging upon the ureters in the pelvis. 4. Interval development of patchy airspace opacities in the left lower lobe with an associated left pleural effusion. Findings are concerning for possible left lower lobe pneumonia versus small volume aspiration. 5. Developing ascites, likely reactive and related to the ileus/bowel obstruction. 6. Persistent subcutaneous emphysema and edema along the soft tissues of the left anterolateral abdomen. 7. Dependent anasarca. 8. Asymmetric left lower extremity soft tissue edema. No evidence of DVT in the well visualized iliac and proximal femoral veins. Electronically Signed   By: Jacqulynn Cadet M.D.   On: 06/08/2019 10:07    Labs:  CBC: Recent Labs    06/05/19 0128 06/06/19 0449 06/09/19 0327 06/10/19 0804  WBC 10.1 10.1 8.6 8.3  HGB 7.8* 8.0* 7.0* 8.6*  HCT 24.5* 25.7* 22.7* 27.3*  PLT 285 364 341 307    COAGS: Recent Labs  06/01/19 0243  INR 1.1  APTT 35    BMP: Recent Labs    06/06/19 0449 06/07/19 0616 06/08/19 0349 06/09/19 0327  NA 142 141 140 141  K 3.6 3.2* 3.8 4.1  CL 104 103 107 106  CO2 26 29 26 28   GLUCOSE 140* 122* 110* 114*  BUN <5* <5* <5* <5*  CALCIUM 7.8* 8.0* 8.0* 8.2*  CREATININE 0.49 0.53 0.46 0.52  GFRNONAA >60 >60 >60  >60  GFRAA >60 >60 >60 >60    LIVER FUNCTION TESTS: Recent Labs    06/02/19 0401 06/06/19 0449 06/07/19 0616 06/09/19 0327  BILITOT 1.6* 1.2 0.5 0.8  AST 13* 42* 28 45*  ALT 11 22 20 28   ALKPHOS 82 59 53 55  PROT 6.0* 5.6* 5.7* 5.4*  ALBUMIN 2.6* 2.5* 2.3* 2.4*    Assessment and Plan:  History of robotic assisted laparoscopic hysterectomy and bilateral salpingectomy in OR 05/25/2019 by Dr. Philis Pique, with subsequent development of pelvic fluid collections s/p right TG drain placement in IR 06/04/2019 by Dr. Laurence Ferrari. Right TG drain stable with approximately 10-15 cc thin tan fluid in suction bulb (additional 20 cc output from drain in past 24 hours per chart). Continue current drain management- continue with Qshift flushes/monitor of output. Plan for repeat CT/possible drain injection when output <10 cc/day (assess for possible removal). Further plans per OB/GYN- appreciate and agree with management. IR to follow.   Electronically Signed: Earley Abide, PA-C 06/11/2019, 9:58 AM   I spent a total of 25 Minutes at the the patient's bedside AND on the patient's hospital floor or unit, greater than 50% of which was counseling/coordinating care for pelvic fluid collection s/p right TG drain placement.

## 2019-06-11 NOTE — Progress Notes (Signed)
Patient is eating, ambulating, and voiding.  Pain control is good.  Cough better.  Vitals:   06/10/19 0529 06/10/19 1410 06/10/19 2148 06/11/19 0600  BP: 136/90 (!) 133/98 (!) 145/93 129/85  Pulse: 97 97 (!) 107 89  Resp: 16 16  16   Temp: 98.8 F (37.1 C) 99.5 F (37.5 C) 99.5 F (37.5 C) 98.6 F (37 C)  TempSrc: Oral Oral Oral Oral  SpO2: 100% 100% 100% 100%  Weight:      Height:        lungs:   clear to auscultation cor:    RRR Abdomen:  soft, appropriate tenderness, incisions intact and without erythema or exudate. ex:    no cords   Lab Results  Component Value Date   WBC 8.3 06/10/2019   HGB 8.6 (L) 06/10/2019   HCT 27.3 (L) 06/10/2019   MCV 98.6 06/10/2019   PLT 307 06/10/2019    A/P HD #10 POD #17 Robotic TLH/Salpingectomies.  Pelvic and abdominal abscesses.  Day are correct, previous notes are one day off.   1.  On Unasyn, Day 10 of antibiotics today.  Afebrile since admission.  WBC down to 8.3.  Plan on 10 days total. Antibiotics are now complete.  2.  Pt tolerating regular diet for 24 hours.  TPN stopped.  No nausea or vomiting.   Abdomen is softer.   3.  Pt received one unit of blood  and H/H now up to  8.6/27.  Headache is better.   4.  Lasix given twice post transfusion.   5.  Possible pneumonia on CT- cough is better today but was significant yesterday  Antibiotics coverage should take care of this.  Tesslon pearls for cough.    6.  JP still in place.  Last 24 hours was 20 cc.  Will need to keep in until <5 cc for 48 hours. Pt will need measuring device for home and I will contact IR tomorrow to see if needs to see them for d/c or if I can do it in office.  Pt will see me weekly with JP in place.    Daria Pastures

## 2019-06-12 ENCOUNTER — Other Ambulatory Visit: Payer: Self-pay | Admitting: Obstetrics and Gynecology

## 2019-06-12 ENCOUNTER — Other Ambulatory Visit (HOSPITAL_COMMUNITY): Payer: Self-pay | Admitting: Radiology

## 2019-06-12 DIAGNOSIS — N739 Female pelvic inflammatory disease, unspecified: Secondary | ICD-10-CM

## 2019-06-12 NOTE — Op Note (Signed)
05/25/2019  1:07 PM  PATIENT:  Deborah Murphy  43 y.o. female  PRE-OPERATIVE DIAGNOSIS:  fibroids  POST-OPERATIVE DIAGNOSIS:  fibroids  PROCEDURE:  Procedure(s): XI ROBOTIC ASSISTED LAPAROSCOPIC HYSTERECTOMY AND SALPINGECTOMY (Bilateral) CYSTOSCOPY (N/A)  SURGEON:  Surgeon(s) and Role:    * Bobbye Charleston, MD - Primary    * Taam-Akelman, Lawrence Santiago, MD - Assisting  ANESTHESIA:   general  EBL:  50 mL   LOCAL MEDICATIONS USED:  OTHER Ropivicaine and Arista  SPECIMEN:  Source of Specimen:  Uterus, cervix, bilateral tubes  DISPOSITION OF SPECIMEN:  PATHOLOGY  COUNTS:  YES  TOURNIQUET:  * No tourniquets in log *  DICTATION: .Note written in EPIC  PLAN OF CARE: Admit for overnight observation  PATIENT DISPOSITION:  PACU - hemodynamically stable.   Delay start of Pharmacological VTE agent (>24hrs) due to surgical blood loss or risk of bleeding: not applicable  Complications:  None.  Findings:  12 weeks size uterus.  Ovaries were normal.  The ureters were identified during multiple points of the case and were always out of the field of dissection.  On cystoscopy, the bladder was intact and bilateral spill was seen from each ureteral orriface.    Medications:  Ancef.  Bupivicaine.    Technique:  After adequate anesthesia was achieved the patient was positioned, prepped and draped in usual sterile fashion. A speculum was placed in the vagina and the cervix dilated with pratt dilators. The 3 cm Koh ring Advinculawasassembled and placed in proper fashion. The Speculum was removed and the bladder catheterized with a foley.   Attention was turned to the abdomen where a 1 cm incision was made 1 cm above the umbilicus. The veress needle was introduced without aspiration of bowel contents or blood and the abdomen insufflated. The 8.91mm Robotictrocar was placed and the other three trocar sites were marked out, all approximately 10 cm from each other  and the camera. Two 8.5 mm trocars were placed on either side of the camera port and a 5 mm assistant port was placed 3 cm above the line of the other trocars. All trocars were inserted under direct visualization of the camera. The patient was placed in trendelenburg and then the Robot docked. The fenestrated bipolarwere placed on arm 1and the Hot shears on arm 3and introduced under direct visualization of the camera.  I then broke scrub and sat down at the console. The above findings were noted and the ureters identified well out of the field of dissection. The right fallopian tube was isolated and cauterized with the bipolar. The Utero-ovarian ligament was then divided with the bipolarcautery and shears. The posterior broad ligament was then divided with the hot shears until the uterosacral ligament. The Broad and cardinal ligaments were then cauterized against the cervix to the level of the Koh ring, securing the uterine artery. Each pedicle was then incised with the shears. The bladder was backfilled to identify the edges ast the tissue was extremely thick.  The Advincula was perforated in the anterior fundus but well superior to the bladder. The anterior leaf was then incised at the reflection of the vessico-uterine junction and the lateral bladder retracted inferiorly after the round ligament had been divided with the bipolarforceps. The left tube was cauterized with the bipolarand divided with the shears; then the left utero-ovarian ligament divided with the bipolarforceps and the scissors. The round ligament was divided as well and the posterior leaf of the broad ligament then divided with the hot shears. The  broad and cardinal ligaments were then cauterized on the left in the same way. At the level of the internal os, the uterine arteries were bilaterally cauterized with the bipolar. The ureters were identified well out of the field of dissection.   The bladder was then able  to be retracted inferiorly with blunt and sharp dissection and the vesico-uterine fascia was incised in the midline until the bladder was removed one cm below the Koh ring. The hot shears then circumferentially incised the vagina at the level of the reflection on the [facility] ring. Once the uterus and cervix were amputated, cautery was used to insure hemostasis of the cuff. Once hemostasis was achieved, the scissors were changed to the mega suture cut needle driver and the cuff was closed with a running stitches of 0-vicryl V loc. Cautery was used to ensure hemostasis of the left pedicles very superficially. The ureters were peristalsing bilaterally well and very lateral to the areas of operation.   The Robot was then undocked and I scrubbed back in. The needle was removed and Ropivicaine was introduced into the pelvis. The skin incisions were closed with subcuticular stitches of 3-0 vicryl Rapide and Dermabond. All instruments were removed from the vagina and cystoscopy performed, revealing an intact bladder and vigourous spill of urine from each ureteral orifice. The cystoscope was removed and the patient taken to the recovery room in stable condition.  Sanah Kraska A

## 2019-06-13 ENCOUNTER — Other Ambulatory Visit: Payer: Self-pay | Admitting: Obstetrics and Gynecology

## 2019-06-19 ENCOUNTER — Ambulatory Visit
Admission: RE | Admit: 2019-06-19 | Discharge: 2019-06-19 | Disposition: A | Discharge status: Home / Self Care | Payer: BC Managed Care – PPO | Source: Ambulatory Visit | Attending: Radiology | Admitting: Radiology

## 2019-06-19 ENCOUNTER — Ambulatory Visit
Admission: RE | Admit: 2019-06-19 | Discharge: 2019-06-19 | Disposition: A | Discharge status: Home / Self Care | Payer: BC Managed Care – PPO | Source: Ambulatory Visit | Attending: Obstetrics and Gynecology | Admitting: Obstetrics and Gynecology

## 2019-06-19 ENCOUNTER — Encounter: Payer: Self-pay | Admitting: *Deleted

## 2019-06-19 DIAGNOSIS — K56609 Unspecified intestinal obstruction, unspecified as to partial versus complete obstruction: Secondary | ICD-10-CM | POA: Diagnosis not present

## 2019-06-19 DIAGNOSIS — N739 Female pelvic inflammatory disease, unspecified: Secondary | ICD-10-CM

## 2019-06-19 DIAGNOSIS — N99820 Postprocedural hemorrhage and hematoma of a genitourinary system organ or structure following a genitourinary system procedure: Secondary | ICD-10-CM | POA: Diagnosis not present

## 2019-06-19 HISTORY — PX: IR RADIOLOGIST EVAL & MGMT: IMG5224

## 2019-06-19 MED ORDER — IOPAMIDOL (ISOVUE-300) INJECTION 61%
100.0000 mL | Freq: Once | INTRAVENOUS | Status: AC | PRN
  Administered 2019-06-19: 100 mL via INTRAVENOUS

## 2019-06-19 NOTE — Progress Notes (Signed)
Chief Complaint: Patient was seen in consultation today for pelvic abscess drain  Referring Physician(s): Dr. Bobbye Charleston  History of Present Illness: Deborah Murphy is a 44 y.o. female who underwent lap robotic assisted hysterectomy and salpingectomy a few weeks ago. She developed post op abscess/hematoma with prolonged ileus. She underwent perc drainage of the dominant pelvic collection on 12/21. She has improved significantly clinically since that time. Her bowel function has normalized and she is eating regular diet. She reports minimal output from the drain, maybe 8-10 mL per day for the past week. She is here for follow up CT and drain eval.  Past Medical History:  Diagnosis Date  . Graves disease   . S/P endometrial ablation     Past Surgical History:  Procedure Laterality Date  . CESAREAN SECTION    . CYSTOSCOPY N/A 05/25/2019   Procedure: CYSTOSCOPY;  Surgeon: Bobbye Charleston, MD;  Location: [facility];  Service: Gynecology;  Laterality: N/A;  . ROBOTIC ASSISTED LAPAROSCOPIC HYSTERECTOMY AND SALPINGECTOMY Bilateral 05/25/2019   Procedure: XI ROBOTIC ASSISTED LAPAROSCOPIC HYSTERECTOMY AND SALPINGECTOMY;  Surgeon: Bobbye Charleston, MD;  Location: [place];  Service: Gynecology;  Laterality: Bilateral;  . TUBAL LIGATION      Allergies: Patient has no known allergies.  Medications: Prior to Admission medications   Medication Sig Start Date End Date Taking? Authorizing Provider  albuterol (VENTOLIN HFA) 108 (90 Base) MCG/ACT inhaler Inhale 2 puffs into the lungs every 6 (six) hours as needed for wheezing or shortness of breath.    [provider]  benzonatate (TESSALON) 200 MG capsule Take 1 capsule (200 mg total) by mouth 3 (three) times daily as needed for cough. Patient not taking: Reported on 06/01/2019 01/23/19   Collene Gobble, MD  docusate sodium (COLACE) 100 MG capsule Take 1 capsule (100 mg total) by mouth  2 (two) times daily. 06/11/19   Bobbye Charleston, MD  esomeprazole (NEXIUM) 20 MG capsule Take 20 mg by mouth every evening.    [provider]  ibuprofen (ADVIL) 800 MG tablet Take 1 tablet (800 mg total) by mouth every 8 (eight) hours as needed (mild pain). 06/11/19   Bobbye Charleston, MD  methimazole (TAPAZOLE) 5 MG tablet Take 0.5 tablets (2.5 mg total) by mouth daily. 09/18/18   Shamleffer, Melanie Crazier, MD  oxyCODONE-acetaminophen (PERCOCET/ROXICET) 5-325 MG tablet Take 1-2 tablets by mouth every 4 (four) hours as needed for severe pain. Patient taking differently: Take 1 tablet by mouth 2 (two) times daily as needed for severe pain.  05/25/19   Bobbye Charleston, MD  Prenatal Vit-Fe Fumarate-FA (PRENATAL MULTIVITAMIN) TABS tablet Take 1 tablet by mouth daily at 12 noon. 06/11/19   Bobbye Charleston, MD  triamcinolone ointment (KENALOG) 0.1 % Apply 1 application topically daily as needed for irritation. 02/06/19   [provider]     Family History  Problem Relation Age of Onset  . Hypertension Mother   . Diabetes Mother     Social History   Socioeconomic History  . Marital status: Single    Spouse name: Not on file  . Number of children: Not on file  . Years of education: Not on file  . Highest education level: Not on file  Occupational History  . Not on file  Tobacco Use  . Smoking status: Never Smoker  . Smokeless tobacco: Never Used  Substance and Sexual Activity  . Alcohol use: No  . Drug use: No  . Sexual activity: Yes  Birth control/protection: Surgical  Other Topics Concern  . Not on file  Social History Narrative  . Not on file   Social Determinants of Health   Financial Resource Strain:   . Difficulty of Paying Living Expenses: Not on file  Food Insecurity:   . Worried About Charity fundraiser in the Last Year: Not on file  . Ran Out of Food in the Last Year: Not on file  Transportation Needs:   . Lack of Transportation (Medical):  Not on file  . Lack of Transportation (Non-Medical): Not on file  Physical Activity:   . Days of Exercise per Week: Not on file  . Minutes of Exercise per Session: Not on file  Stress:   . Feeling of Stress : Not on file  Social Connections:   . Frequency of Communication with Friends and Family: Not on file  . Frequency of Social Gatherings with Friends and Family: Not on file  . Attends Religious Services: Not on file  . Active Member of Clubs or Organizations: Not on file  . Attends Archivist Meetings: Not on file  . Marital Status: Not on file     Review of Systems: A 12 point ROS discussed and pertinent positives are indicated in the HPI above.  All other systems are negative.  Review of Systems  Vital Signs: LMP  (LMP Unknown)   Physical Exam Constitutional:      Appearance: Normal appearance.  Cardiovascular:     Rate and Rhythm: Normal rate and regular rhythm.     Heart sounds: Normal heart sounds.  Pulmonary:     Effort: Pulmonary effort is normal. No respiratory distress.     Breath sounds: Normal breath sounds.  Abdominal:     General: There is no distension.     Palpations: Abdomen is soft.     Tenderness: There is no abdominal tenderness.  Skin:    Comments: (R)transgluteal drain intact, site clean, dry. Output hazy pink/serosanguinous  Neurological:     Mental Status: She is alert.     Imaging: DG Abd 1 View  Result Date: 06/01/2019 CLINICAL DATA:  NG tube placement. EXAM: ABDOMEN - 1 VIEW COMPARISON:  CT 06/01/2019. FINDINGS: NG tube noted with tip coiled in stomach. Distended loops of small bowel again noted. Reference is made to CT report 06/01/2019 for further discussion of small-bowel distention and free air. Contrast noted in the renal collecting systems and bladder from prior CT. No acute bony abnormality. IMPRESSION: NG tube noted with tip coiled in stomach. Distended loops of small bowel again noted. Reference is made to CT report  06/01/2023 for further discussion of small-bowel distention and free air. Electronically Signed   By: Marcello Moores  Register   On: 06/01/2019 06:33   CT ABDOMEN PELVIS W CONTRAST  Result Date: 06/08/2019 CLINICAL DATA:  44 year old female status post robotic assisted hysterectomy complicated by intra-abdominal fluid collections. She underwent placement of a percutaneous drain on 06/04/2019. EXAM: CT ABDOMEN AND PELVIS WITH CONTRAST TECHNIQUE: Multidetector CT imaging of the abdomen and pelvis was performed using the standard protocol following bolus administration of intravenous contrast. CONTRAST:  132mL OMNIPAQUE IOHEXOL 300 MG/ML  SOLN COMPARISON:  Prior CT abdomen/pelvis 06/04/2019 FINDINGS: Lower chest: Interval development of a patchy airspace opacities in the periphery of the left lower lobe with a small associated pleural effusion. Mild dependent atelectasis in the right lower lobe. The visualized cardiac structures are within normal limits. No pericardial effusion. Hepatobiliary: No hepatic injury or perihepatic  hematoma. Gallbladder is unremarkable Pancreas: Unremarkable. No pancreatic ductal dilatation or surrounding inflammatory changes. Spleen: Normal in size without focal abnormality. Adrenals/Urinary Tract: Interval development of a mild bilateral hydronephrosis. The adrenal glands are unremarkable. The bladder is unremarkable. The right ureter is mildly dilated. Stomach/Bowel: Significant dilation of small bowel throughout the abdomen with a focal transition in the mid abdomen small bowel is dilated up to 4.6 cm. Vascular/Lymphatic: No significant vascular findings are present. No enlarged abdominal or pelvic lymph nodes. Reproductive: Surgical changes of hysterectomy.  No adnexal masses. Other: Multiple peripherally enhancing intermediate attenuation fluid collections are again identified in the pelvis and low abdomen in the region of the small bowel mesentery. The collection is somewhat serpentine  in configuration and all loculations appear to communicate centrally. This is most well demonstrated on the coronal reformatted images. The collections are more well-defined on today's examination compared to 12/21 but are not substantially different in size. The largest collection within the small bowel mesentery measures 4.4 by 5.0 x 3.3 cm. The percutaneous drainage catheter enters via a right transgluteal approach and is well positioned in the central aspect of the complex fluid collections. The collection in the pelvic cul-de-sac is slightly improved at 4.7 x 2.8 cm compared to 5.2 x 4.5 cm previously. Interval development of mild ascites. Persistent subcutaneous air along the left abdominal wall. Dependent edema is again noted in the subcutaneous fat posteriorly. Additionally, edema now extends along the mons pubis and left lower extremity. Musculoskeletal: No acute fracture or aggressive appearing lytic or blastic osseous lesion. IMPRESSION: 1. Persistent and more well-defined peripherally enhancing fluid collection with the epicenter in the surgical bed in the pelvis. The fluid collection is stellate with multiple finger-like loculations which radiate out in the pelvis and up into the small bowel mesentery. The percutaneous drainage catheter remains well positioned within the central aspect of the fluid collection. The portion in the pelvic cul-de-sac is slightly improved compared to 12/21, however the other loculations are insignificantly changed. As noted at the time of percutaneous drain placement, this likely represents postoperative hematoma. 2. Severe ileus versus partial small bowel obstruction with a transition point in the mid abdomen presumably related to underlying adhesions. 3. Developing bilateral mild hydronephrosis likely secondary to inflammatory changes impinging upon the ureters in the pelvis. 4. Interval development of patchy airspace opacities in the left lower lobe with an associated  left pleural effusion. Findings are concerning for possible left lower lobe pneumonia versus small volume aspiration. 5. Developing ascites, likely reactive and related to the ileus/bowel obstruction. 6. Persistent subcutaneous emphysema and edema along the soft tissues of the left anterolateral abdomen. 7. Dependent anasarca. 8. Asymmetric left lower extremity soft tissue edema. No evidence of DVT in the well visualized iliac and proximal femoral veins. Electronically Signed   By: Jacqulynn Cadet M.D.   On: 06/08/2019 10:07   CT ABDOMEN PELVIS W CONTRAST  Result Date: 06/04/2019 CLINICAL DATA:  Pelvic abscesses. Partial bowel obstruction. EXAM: CT ABDOMEN AND PELVIS WITH CONTRAST TECHNIQUE: Multidetector CT imaging of the abdomen and pelvis was performed using the standard protocol following bolus administration of intravenous contrast. CONTRAST:  134mL OMNIPAQUE IOHEXOL 300 MG/ML  SOLN COMPARISON:  06/01/2019 FINDINGS: Lower chest: Faint reticulonodular opacity in the right lower lobe on image 1/4, probably related to atypical infectious process. Hepatobiliary: Gallbladder wall thickening is present. Dependent density in the gallbladder likely from sludge. Pancreas: Unremarkable Spleen: Unremarkable Adrenals/Urinary Tract: Unremarkable Stomach/Bowel: The orally administered contrast is only in dilated  proximal loops of small bowel demonstrates progressive dilution in dilated loops of distal jejunum site of obstruction difficult to be certain of but probably in the vicinity of an angulated loop in the mid abdomen in the small bowel. There is a network of pelvic abscesses difficult to separate from small bowel loops. A representative abscess anterior to the rectum measures 5.2 by 4.5 by 3.9 cm (volume = 48 cm^3), previously 3.6 by 2.4 by 3.1 cm (volume = 14 cm^3). Other smaller abscesses and collections of free fluid are present in the pelvis, with fluid and a small amount of gas tracking along the pelvic  sidewalls and right groin, and with free air along the right hepatic lobe margin although reduced in size compared to previous. In addition, the amount of subcutaneous edema tracking along the groin, bilateral abdomen, and in the deep tissues the left breast is reduced compared to prior. Vascular/Lymphatic: Unremarkable Reproductive: Prior hysterectomy. Other: Stranding in the mesentery. Musculoskeletal: Unremarkable IMPRESSION: 1. The dominant abscess anterior to the rectum has increased in size compared to the prior exam, currently 48 cubic cm and previously 14 cubic cm. Other smaller abscesses are scattered in the pelvis. 2. Dilated loops of proximal small bowel extending down to a loop of distal jejunum or proximal ileum which takes an angulated course in the mid abdomen at the level of the iliac crests, and then courses inferiorly into the reticular network of pelvic abscesses. Possibilities include adhesion causing partial small bowel obstruction, versus ileus related to the abscesses. 3. Reduced volume of free intraperitoneal gas (but not resolved). Reduced volume of subcutaneous edema in the groin, bilateral abdomen, and pelvis. 4. Faint reticulonodular opacity in the right lower lobe, probably related to atypical infectious process. 5. Gallbladder wall thickening is nonspecific and could be from inflammation, hypoproteinemia, contraction, or hypoproteinemia. Electronically Signed   By: Van Clines M.D.   On: 06/04/2019 12:31   CT Abdomen Pelvis W Contrast  Result Date: 06/01/2019 CLINICAL DATA:  Acute abdominal pain. Status post hysterectomy 7 days ago. Fever. Nausea and vomiting. EXAM: CT ABDOMEN AND PELVIS WITH CONTRAST TECHNIQUE: Multidetector CT imaging of the abdomen and pelvis was performed using the standard protocol following bolus administration of intravenous contrast. CONTRAST:  174mL OMNIPAQUE IOHEXOL 300 MG/ML  SOLN COMPARISON:  None. FINDINGS: Lower chest: The lung bases are clear  without focal nodule, mass, or airspace disease. Heart size is normal. Hepatobiliary: No focal liver abnormality is seen. No gallstones, gallbladder wall thickening, or biliary dilatation. Pancreas: Unremarkable. No pancreatic ductal dilatation or surrounding inflammatory changes. Spleen: Normal in size without focal abnormality. Adrenals/Urinary Tract: Adrenal glands are normal bilaterally. Kidneys and ureters are within normal limits. Diffuse wall thickening is present about the urinary bladder, likely secondary inflammation. No discrete lesion is present. Stomach/Bowel: The stomach and duodenum are within normal limits. The small bowel is diffusely dilated. Loops measure up to 3.2 cm. Dilated bowel extends into the anatomic pelvis. The colon is relatively collapsed. Vascular/Lymphatic: Subcentimeter retroperitoneal lymph nodes are likely reactive. Focal vascular lesions are present. Reproductive: Patient is status post hysterectomy. Other: Extensive free air is present throughout the abdomen. Multiple developing fluid collections are present throughout the abdomen and pelvis. The largest is within the anatomic pelvis, in the cul-de-sac, likely accessible for percutaneous drainage. This collection measures 4.6 x 2.6 x 2.6 cm. The collection tracks anteriorly along the right side of the pelvis. A second collection is present near the cecum. An anterior collection is noted just above the  urinary bladder which may be accessible for percutaneous drainage. This collection measures 3.3 x 1.7 x 2.4 cm. Other smaller collections are more difficult to access. A collection of gas and fluid present near the gallbladder on image 27 and 28/2. This is likely a separate collection from bowel. Does not appear to be accessible. Extensive subcutaneous emphysema is present in addition to the pneumoperitoneum. There is subcutaneous gas over the lower anatomic pelvis and extending into the upper thighs bilaterally. Gas extends of the  left side of the abdomen to the chest wall and below the left breast. Musculoskeletal: No acute or significant osseous findings. IMPRESSION: 1. Complex collections of fluid and gas throughout the pelvis and abdomen consistent with developing abscesses. At least 2 of these areas would be amenable for percutaneous drainage. 2. Small bowel obstruction with transition in the pelvis likely secondary to the diffuse inflammatory changes. 3. In addition to pneumoperitoneum, there is extensive subcutaneous gas over the low anatomic pelvis, into the thighs, and over the left side of the abdomen and lower chest wall. These results were called by telephone at the time of interpretation on 06/01/2019 at 4:35 am to provider Addison Lank, MD, who verbally acknowledged these results. Electronically Signed   By: San Morelle M.D.   On: 06/01/2019 04:48   DG Chest Port 1 View  Result Date: 06/01/2019 CLINICAL DATA:  Fever and tachycardia. Hysterectomy 05/25/2019. Increasing pain. EXAM: PORTABLE CHEST 1 VIEW COMPARISON:  Chest radiograph 11/09/2018 FINDINGS: The cardiomediastinal contours are normal. The lungs are clear. Pulmonary vasculature is normal. No consolidation, pleural effusion, or pneumothorax. No acute osseous abnormalities are seen. Free air under the right hemidiaphragm. IMPRESSION: 1. No acute chest findings. 2. Free air under the right hemidiaphragm. While this may be related to recent abdominal surgery, consider further evaluation with abdominopelvic CT for further evaluation based on clinical concern. Electronically Signed   By: Keith Rake M.D.   On: 06/01/2019 02:25   DG Abd Portable 1V  Result Date: 06/06/2019 CLINICAL DATA:  Abdominal pain. EXAM: PORTABLE ABDOMEN - 1 VIEW COMPARISON:  Abdominal CT from 2 days ago FINDINGS: Continued dilated small bowel preferentially. There is mottled gas over the left flank which is subcutaneous by recent CT. Extensive pelvic abscess on prior abdominal  CT. Percutaneous catheter overlaps the central pelvis. Enteric tube with tip over the left abdomen, likely at the duodenal jejunal junction. The stomach does not appear distended enough for the tube to be looping through the stomach only. IMPRESSION: 1. Dilated small bowel with obstructive pattern that was also seen on prior abdominal CT and secondary to the pelvic inflammation. 2. Enteric tube tip likely at the DJ junction. Electronically Signed   By: Monte Fantasia M.D.   On: 06/06/2019 10:04   CT IMAGE GUIDED DRAINAGE BY PERCUTANEOUS CATHETER  Result Date: 06/05/2019 INDICATION: 44 year old female status post uncomplicated robotic assisted hysterectomy. Her postoperative course was subsequently complicated by development of multiple intra-abdominal fluid collections. It is uncertain if these represent areas of hematoma or abscess. The largest collection in the pelvic cul-de-sac has enlarged over serial imaging and she now presents for transgluteal drainage. EXAM: CT-guided drain placement MEDICATIONS: The patient is currently admitted to the hospital and receiving intravenous antibiotics. The antibiotics were administered within an appropriate time frame prior to the initiation of the procedure. ANESTHESIA/SEDATION: Fentanyl 100 mcg IV; Versed 2 mg IV Moderate Sedation Time:  10 minutes The patient was continuously monitored during the procedure by the interventional radiology nurse under  my direct supervision. COMPLICATIONS: None immediate. PROCEDURE: Informed written consent was obtained from the patient after a thorough discussion of the procedural risks, benefits and alternatives. All questions were addressed. Maximal Sterile Barrier Technique was utilized including caps, mask, sterile gowns, sterile gloves, sterile drape, hand hygiene and skin antiseptic. A timeout was performed prior to the initiation of the procedure. A planning axial CT scan was performed. The complex fluid collection in the pelvic  cul-de-sac was successfully identified. A suitable skin entry site from a right transgluteal approach was selected and marked. The overlying skin was sterilely prepped and draped in the standard fashion using chlorhexidine skin prep. Local anesthesia was attained by infiltration with 1% lidocaine. A small dermatotomy was made. Under intermittent CT guidance, an 18 gauge trocar needle was advanced along a transgluteal, parasacral course into the fluid collection. A 0.035 wire was then coiled in the fluid collection. The needle was removed. The skin tract was dilated to 12 [place]. A Cook 12 [place] all-purpose drainage catheter was then advanced over the wire and formed. Aspiration yields 15-20 mL of dark bloody fluid. No frank purulence. Samples were sent for Gram stain and culture. The drain was gently flushed with saline and connected to JP bulb suction. The drain was then secured to the skin with 0 Prolene suture. Follow-up axial CT imaging demonstrates a well-positioned drainage catheter and no evidence of immediate complication. IMPRESSION: 1. Successful placement of a 24 French percutaneous drain via a right transgluteal approach. Aspiration yields a relatively small volume of dark bloody fluid. This appears to represent a liquified hematoma. Although no frank purulence was encountered, a sample of the aspirated blood was sent for Gram stain and culture. PLAN: Maintain drain to JP bulb suction. Flush drain with 10 cc saline once per shift. When drain output is scant (no more than 5-10 mL over the volume of flushes) for 48 hours or longer the drainage catheter can be removed. Signed, Criselda Peaches, MD, [facility] Electronically Signed   By: Jacqulynn Cadet M.D.   On: 06/05/2019 08:26   [place] EKG SITE RITE  Result Date: 06/05/2019 If Site Rite image not attached, placement could not be confirmed due to current cardiac  rhythm.   Labs:  CBC: Recent Labs    06/05/19 0128 06/06/19 0449 06/09/19 0327 06/10/19 0804  WBC 10.1 10.1 8.6 8.3  HGB 7.8* 8.0* 7.0* 8.6*  HCT 24.5* 25.7* 22.7* 27.3*  PLT 285 364 341 307    COAGS: Recent Labs    06/01/19 0243  INR 1.1  APTT 35    BMP: Recent Labs    06/06/19 0449 06/07/19 0616 06/08/19 0349 06/09/19 0327  NA 142 141 140 141  K 3.6 3.2* 3.8 4.1  CL 104 103 107 106  CO2 26 29 26 28   GLUCOSE 140* 122* 110* 114*  BUN <5* <5* <5* <5*  CALCIUM 7.8* 8.0* 8.0* 8.2*  CREATININE 0.49 0.53 0.46 0.52  GFRNONAA >60 >60 >60 >60  GFRAA >60 >60 >60 >60    LIVER FUNCTION TESTS: Recent Labs    06/02/19 0401 06/06/19 0449 06/07/19 0616 06/09/19 0327  BILITOT 1.6* 1.2 0.5 0.8  AST 13* 42* 28 45*  ALT 11 22 20 28   ALKPHOS 82 59 53 55  PROT 6.0* 5.6* 5.7* 5.4*  ALBUMIN 2.6* 2.5* 2.3* 2.4*    TUMOR MARKERS: No results for input(s): AFPTM, CEA, CA199, CHROMGRNA in the last 8760 hours.  Assessment and Plan:  Post hysterectomy pelvic abscess/hematoma S/p perc drain 12/21 CT today shows marked improvement, see full report for details. No residual abscess at drain catheter location. Drain pulled without complications. Pt to follow up with Dr. Philis Pique as scheduled  Thank you for this interesting consult.  I greatly enjoyed meeting Collie Lapiana and look forward to participating in their care.  A copy of this report was sent to the requesting provider on this date.  Electronically Signed: Ascencion Dike 06/19/2019, 11:46 AM   I spent a total of 25 minutes in face to face in clinical consultation, greater than 50% of which was counseling/coordinating care for pelvic abscess drain

## 2019-07-24 ENCOUNTER — Other Ambulatory Visit: Payer: Self-pay

## 2019-07-24 ENCOUNTER — Ambulatory Visit: Payer: BC Managed Care – PPO | Admitting: Internal Medicine

## 2019-07-24 VITALS — BP 118/72 | HR 98 | Temp 98.5°F | Ht 66.0 in | Wt 126.0 lb

## 2019-07-24 DIAGNOSIS — E559 Vitamin D deficiency, unspecified: Secondary | ICD-10-CM | POA: Diagnosis not present

## 2019-07-24 DIAGNOSIS — E0500 Thyrotoxicosis with diffuse goiter without thyrotoxic crisis or storm: Secondary | ICD-10-CM | POA: Diagnosis not present

## 2019-07-24 LAB — T4, FREE: Free T4: 0.66 ng/dL (ref 0.60–1.60)

## 2019-07-24 LAB — VITAMIN D 25 HYDROXY (VIT D DEFICIENCY, FRACTURES): VITD: 16.92 ng/mL — ABNORMAL LOW (ref 30.00–100.00)

## 2019-07-24 LAB — TSH: TSH: 1.09 u[IU]/mL (ref 0.35–4.50)

## 2019-07-24 NOTE — Progress Notes (Signed)
Name: Deborah Murphy  MRN/ DOB: [id number], May 20, 1976    Age/ Sex: 44 y.o., female     PCP: Seward Carol, MD   Reason for Endocrinology Evaluation: Hyperthyroidism     Initial Endocrinology Clinic Visit: 06/09/18    PATIENT IDENTIFIER: Deborah Murphy is a 44 y.o., female with a and remarkable past medical history. She has followed with [facility] Endocrinology clinic since June 09, 2018 for consultative assistance with management of her Berenice Primas' disease.   HISTORICAL SUMMARY: Pt noted left eye changes during the summer, 219 . She saw her ophthalmologist and that's when she was thought to have Graves' disease. She presented to her PCP and TSH confirmed to be low, with normal FT4 and T3.  A thyroid uptake and Scan consistent with Graves' Disease   Methimazole started in 05/2018   S/P tubal ligation 2005  No Fh of thyroid disease SUBJECTIVE:   During last visit (03/21/2019): We continued  Methimazole 2.5 mg daily   Today (07/24/2019):  [name] is here for a 4 month follow up on her Graves' Disease. Since her last visit here ,she has had a total hysterectomy [id number] complicated by sepsis and bleeding, she required hospitalization and was on NGT feeds . She believes she was not given methimazole during hospitalization, but has been back on it for a few weeks now. She has knee pain worse on the right She denies diarrhea or palpitations Weight has been stable   Denies any local neck symptoms.    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     ROS:  As per HPI.   HISTORY:   Past Medical History: Graves' disease Past Surgical History:  Past Surgical History:  Procedure Laterality Date  . CESAREAN SECTION    . CYSTOSCOPY N/A 05/25/2019   Procedure: CYSTOSCOPY;  Surgeon: Bobbye Charleston, MD;  Location: [facility];  Service: Gynecology;  Laterality: N/A;  . IR RADIOLOGIST EVAL & MGMT  06/19/2019  . ROBOTIC ASSISTED LAPAROSCOPIC  HYSTERECTOMY AND SALPINGECTOMY Bilateral 05/25/2019   Procedure: XI ROBOTIC ASSISTED LAPAROSCOPIC HYSTERECTOMY AND SALPINGECTOMY;  Surgeon: Bobbye Charleston, MD;  Location: [place];  Service: Gynecology;  Laterality: Bilateral;  . TUBAL LIGATION      Social History:  reports that she has never smoked. She has never used smokeless tobacco. She reports that she does not drink alcohol or use drugs.  Family History: family history includes Diabetes in her mother; Hypertension in her mother.   HOME MEDICATIONS: Allergies as of 07/24/2019   No Known Allergies     Medication List       Accurate as of July 24, 2019 12:40 PM. If you have any questions, ask your nurse or doctor.        albuterol 108 (90 Base) MCG/ACT inhaler Commonly known as: VENTOLIN HFA Inhale 2 puffs into the lungs every 6 (six) hours as needed for wheezing or shortness of breath.   benzonatate 200 MG capsule Commonly known as: TESSALON Take 1 capsule (200 mg total) by mouth 3 (three) times daily as needed for cough.   docusate sodium 100 MG capsule Commonly known as: COLACE Take 1 capsule (100 mg total) by mouth 2 (two) times daily.   esomeprazole 20 MG capsule Commonly known as: NEXIUM Take 20 mg by mouth every evening.   ibuprofen 800 MG tablet Commonly known as: ADVIL Take 1 tablet (800 mg total) by mouth every 8 (eight) hours as needed (mild pain).  methimazole 5 MG tablet Commonly known as: TAPAZOLE Take 0.5 tablets (2.5 mg total) by mouth daily.   oxyCODONE-acetaminophen 5-325 MG tablet Commonly known as: PERCOCET/ROXICET Take 1-2 tablets by mouth every 4 (four) hours as needed for severe pain. What changed:   how much to take  when to take this   prenatal multivitamin Tabs tablet Take 1 tablet by mouth daily at 12 noon.   triamcinolone ointment 0.1 % Commonly known as: KENALOG Apply 1 application topically daily as needed for irritation.         OBJECTIVE:    PHYSICAL EXAM: VS: LMP  (LMP Unknown)    EXAM: General: Pt appears well and is in NAD  Eyes Eyes: Decreased left stare, no lid lag or exophthalmos   Neck: General: Supple without adenopathy. Thyroid: Thyroid size normal.  No goiter or nodules appreciated. No thyroid bruit.  Lungs: Clear with good BS bilat with no rales, rhonchi, or wheezes  Heart: Auscultation: RRR.  Abdomen: Normoactive bowel sounds, soft, nontender, without masses or organomegaly palpable  Extremities:  BL LE: No pretibial edema normal ROM and strength.  Mental Status: Judgment, insight: Intact Orientation: Oriented to time, place, and person Mood and affect: No depression, anxiety, or agitation     DATA REVIEWED: 04/19/2018  TSH < 0.01 uIU/mL  FT4 0.85 ng/dL  T3 181 ng/dL (71-180)   Results for KELINA, DORCELY (MRN [id number]) as of 07/25/2019 10:13  Ref. Range 07/24/2019 14:14  TSH Latest Ref Range: 0.35 - 4.50 uIU/mL 1.09  T4,Free(Direct) Latest Ref Range: 0.60 - 1.60 ng/dL 0.66    ASSESSMENT / PLAN / RECOMMENDATIONS:   1.  Hyperthyroidism Secondary to Graves' disease:  -She is clinically euthyroid  -She denies any side effects to methimazole -She denies any local neck symptoms - FT4 is trending downwards, will stop methimazole and recheck in 8 weeks    Medications  Stop Methimazole  2.5 mg Lab in 8 weeks    2.  Graves' disease:  - Her left eye orbitopathy seems to be resolving.  - She already has been using artifical tears - She is up to date on her eye exams   3. Vitamin D Deficiency :   - Will replenish with ergocalciferol 50,000 iu weekly for 3 months, then she will switch to OTC Vitamin D3  1000 iu daily     Follow-up in 4 months     Signed electronically by: Mack Guise, MD  [facility] Endocrinology  [address] [place], [place] [address] Phone: [phone] FAX: [phone]      CC: Seward Carol, [address] [place]  [address] [place] [address] Phone: [phone]  Fax: [phone]   Return to Endocrinology clinic as below: Future Appointments  Date Time Provider [place]  07/24/2019  2:00 PM Jerrelle Michelsen, Melanie Crazier, MD [id number] None

## 2019-07-24 NOTE — Patient Instructions (Signed)
We recommend that you follow these hyperthyroidism instructions at home:  1) Take Methimazole 2.5 mg once a day  If you develop severe sore throat with high fevers OR develop unexplained yellowing of your skin, eyes, under your tongue, severe abdominal pain with nausea or vomiting --> then please get evaluated immediately.    

## 2019-07-25 ENCOUNTER — Encounter: Payer: Self-pay | Admitting: Internal Medicine

## 2019-07-25 DIAGNOSIS — E559 Vitamin D deficiency, unspecified: Secondary | ICD-10-CM | POA: Insufficient documentation

## 2019-07-25 MED ORDER — VITAMIN D (ERGOCALCIFEROL) 1.25 MG (50000 UNIT) PO CAPS: 50000.0000 [IU] | ORAL_CAPSULE | ORAL | 0 refills | Status: AC

## 2019-09-18 ENCOUNTER — Other Ambulatory Visit (INDEPENDENT_AMBULATORY_CARE_PROVIDER_SITE_OTHER): Payer: BC Managed Care – PPO

## 2019-09-18 ENCOUNTER — Other Ambulatory Visit: Payer: Self-pay

## 2019-09-18 DIAGNOSIS — E0500 Thyrotoxicosis with diffuse goiter without thyrotoxic crisis or storm: Secondary | ICD-10-CM

## 2019-09-18 LAB — T4, FREE: Free T4: 0.71 ng/dL (ref 0.60–1.60)

## 2019-09-18 LAB — TSH: TSH: 0.93 u[IU]/mL (ref 0.35–4.50)

## 2019-09-27 ENCOUNTER — Encounter: Payer: Self-pay | Admitting: Internal Medicine

## 2019-10-25 DIAGNOSIS — H10413 Chronic giant papillary conjunctivitis, bilateral: Secondary | ICD-10-CM | POA: Diagnosis not present

## 2019-10-25 DIAGNOSIS — H5213 Myopia, bilateral: Secondary | ICD-10-CM | POA: Diagnosis not present

## 2019-11-09 ENCOUNTER — Other Ambulatory Visit: Payer: Self-pay | Admitting: Internal Medicine

## 2019-11-21 ENCOUNTER — Ambulatory Visit: Payer: BC Managed Care – PPO | Admitting: Internal Medicine

## 2019-11-23 ENCOUNTER — Other Ambulatory Visit: Payer: Self-pay

## 2019-11-23 ENCOUNTER — Encounter: Payer: Self-pay | Admitting: Internal Medicine

## 2019-11-23 ENCOUNTER — Ambulatory Visit: Payer: BC Managed Care – PPO | Admitting: Internal Medicine

## 2019-11-23 VITALS — BP 132/64 | HR 89 | Ht 66.0 in | Wt 127.4 lb

## 2019-11-23 DIAGNOSIS — E0500 Thyrotoxicosis with diffuse goiter without thyrotoxic crisis or storm: Secondary | ICD-10-CM

## 2019-11-23 NOTE — Progress Notes (Signed)
Name: Deborah Murphy  MRN/ DOB: [id number], [birth year]    Age/ Sex: 44 y.o., female     PCP: Seward Carol, MD   Reason for Endocrinology Evaluation: Hyperthyroidism     Initial Endocrinology Clinic Visit: 06/09/18    PATIENT IDENTIFIER: Deborah Murphy is a 44 y.o., female with a and remarkable past medical history. She has followed with [facility] Endocrinology clinic since June 09, 2018 for consultative assistance with management of her Berenice Primas' disease.   HISTORICAL SUMMARY: Pt noted left eye changes during the summer, 219 . She saw her ophthalmologist and that's when she was thought to have Graves' disease. She presented to her PCP and TSH confirmed to be low, with normal FT4 and T3.  A thyroid uptake and Scan consistent with Graves' Disease   Methimazole started in 05/2018 and stopped 07/24/2019   S/P tubal ligation 2005  No Fh of thyroid disease SUBJECTIVE:   During last visit (03/21/2019): We continued  Methimazole 2.5 mg daily   Today (11/23/2019):  Deborah Murphy is here for a 4 month follow up on her Graves' Disease. She denies diarrhea or palpitations Weight has been stable  Has noted transient tremors   Denies any local neck symptoms.    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     ROS:  As per HPI.   HISTORY:   Past Medical History: Graves' disease Past Surgical History:  Past Surgical History:  Procedure Laterality Date  . CESAREAN SECTION    . CYSTOSCOPY N/A 05/25/2019   Procedure: CYSTOSCOPY;  Surgeon: Bobbye Charleston, MD;  Location: [facility];  Service: Gynecology;  Laterality: N/A;  . IR RADIOLOGIST EVAL & MGMT  06/19/2019  . ROBOTIC ASSISTED LAPAROSCOPIC HYSTERECTOMY AND SALPINGECTOMY Bilateral 05/25/2019   Procedure: XI ROBOTIC ASSISTED LAPAROSCOPIC HYSTERECTOMY AND SALPINGECTOMY;  Surgeon: Bobbye Charleston, MD;  Location: [place];  Service: Gynecology;  Laterality: Bilateral;  . TUBAL  LIGATION      Social History:  reports that she has never smoked. She has never used smokeless tobacco. She reports that she does not drink alcohol and does not use drugs.  Family History: family history includes Diabetes in her mother; Hypertension in her mother.   HOME MEDICATIONS: Allergies as of 11/23/2019   No Known Allergies     Medication List       Accurate as of November 23, 2019  3:46 PM. If you have any questions, ask your nurse or doctor.        albuterol 108 (90 Base) MCG/ACT inhaler Commonly known as: VENTOLIN HFA Inhale 2 puffs into the lungs every 6 (six) hours as needed for wheezing or shortness of breath.   benzonatate 200 MG capsule Commonly known as: TESSALON Take 1 capsule (200 mg total) by mouth 3 (three) times daily as needed for cough.   docusate sodium 100 MG capsule Commonly known as: COLACE Take 1 capsule (100 mg total) by mouth 2 (two) times daily.   esomeprazole 20 MG capsule Commonly known as: NEXIUM Take 20 mg by mouth every evening.   ibuprofen 800 MG tablet Commonly known as: ADVIL Take 1 tablet (800 mg total) by mouth every 8 (eight) hours as needed (mild pain).   oxyCODONE-acetaminophen 5-325 MG tablet Commonly known as: PERCOCET/ROXICET Take 1-2 tablets by mouth every 4 (four) hours as needed for severe pain.   prenatal multivitamin Tabs tablet Take 1 tablet by mouth daily at 12 noon.   triamcinolone ointment 0.1 %  Commonly known as: KENALOG Apply 1 application topically daily as needed for irritation.   Vitamin D (Ergocalciferol) 1.25 MG (50000 UNIT) Caps capsule Commonly known as: DRISDOL Take 1 capsule (50,000 Units total) by mouth every 7 (seven) days.         OBJECTIVE:   PHYSICAL EXAM: VS: BP 132/64 (BP Location: Left Arm, Patient Position: Sitting, Cuff Size: Normal)   Pulse 89   Ht 5\' 6"  (1.676 m)   Wt 127 lb 6.4 oz (57.8 kg)   LMP  (LMP Unknown)   SpO2 99%   BMI 20.56 kg/m    EXAM: General: Pt appears  well and is in NAD  Eyes Eyes: Decreased left stare, no lid lag or exophthalmos   Neck: General: Supple without adenopathy. Thyroid: Thyroid size normal.  No goiter or nodules appreciated. No thyroid bruit.  Lungs: Clear with good BS bilat with no rales, rhonchi, or wheezes  Heart: Auscultation: RRR.  Abdomen: Normoactive bowel sounds, soft, nontender, without masses or organomegaly palpable  Extremities:  BL LE: No pretibial edema normal ROM and strength.  Mental Status: Judgment, insight: Intact Orientation: Oriented to time, place, and person Mood and affect: No depression, anxiety, or agitation     DATA REVIEWED: Results for ANETH, SCHLAGEL (MRN [id number]) as of 11/26/2019 08:19  Ref. Range 11/23/2019 16:05  TSH Latest Ref Range: 0.450 - 4.500 uIU/mL 1.020  T4,Free(Direct) Latest Ref Range: 0.82 - 1.77 ng/dL 0.91    ASSESSMENT / PLAN / RECOMMENDATIONS:   1.  Hyperthyroidism Secondary to Graves' disease:  -She is clinically euthyroid  -She has been off methimazole since 07/2019 - Repeat TFT's remain to be normal. Pt continued to be in remission      2.  Graves' disease:  - Her left eye orbitopathy seems to be resolving.  - She is up to date on her eye exams      Follow-up in 6 months     Signed electronically by: Mack Guise, MD  [facility] Endocrinology  [address] [place], [place] [address] Phone: [phone] FAX: [phone]      CC: Seward Carol, [address] [place] [address] Phone: [phone]  Fax: [phone]   Return to Endocrinology clinic as below: No future appointments.

## 2019-11-23 NOTE — Patient Instructions (Signed)
-   Please stop by the lab today  

## 2019-11-24 LAB — TSH: TSH: 1.02 u[IU]/mL (ref 0.450–4.500)

## 2019-11-24 LAB — T4, FREE: Free T4: 0.91 ng/dL (ref 0.82–1.77)

## 2020-01-07 IMAGING — CT CT ABD-PELV W/ CM
2 of 5 series · 13 of 46 positions shown, 15 images · IV contrast (omnipaque)
Comparison: Prior CT abdomen/pelvis 06/04/2019

CLINICAL DATA: 43-year-old female status post robotic assisted
hysterectomy complicated by intra-abdominal fluid collections. She
underwent placement of a percutaneous drain on 06/04/2019.

EXAM:
CT ABDOMEN AND PELVIS WITH CONTRAST
TECHNIQUE: Multidetector CT imaging of the abdomen and pelvis was performed
using the standard protocol following bolus administration of
intravenous contrast.
CONTRAST:  100mL OMNIPAQUE IOHEXOL 300 MG/ML  SOLN

[Series 3: abdomen 5.0 · axial · 0.66mm/px · z∈[+820,+1235]mm · 10 of 95 slices shown, 12 images]
[im 6/95  soft-tissue]
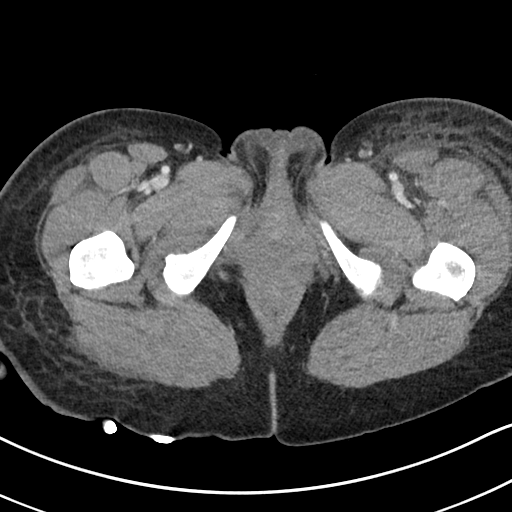
[im 6/95  bone]
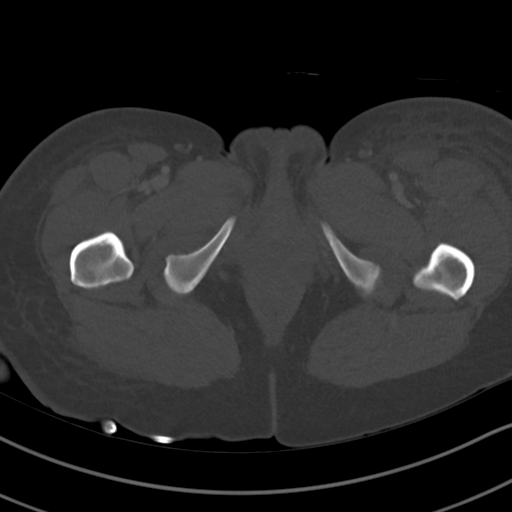
[im 17/95  soft-tissue]
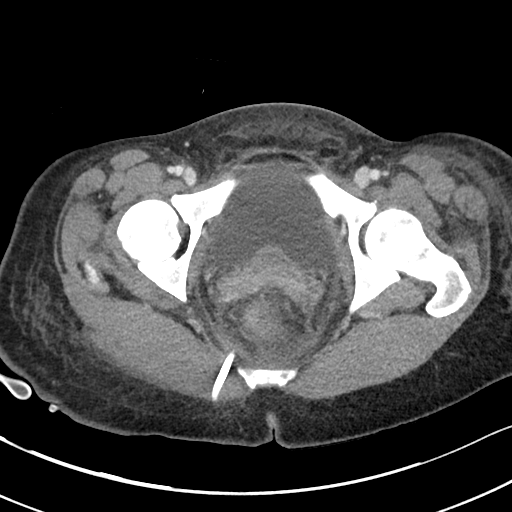
[im 28/95  soft-tissue]
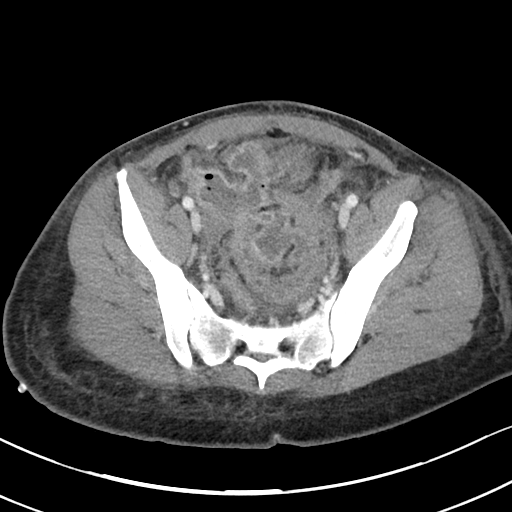
[im 34/95  soft-tissue]
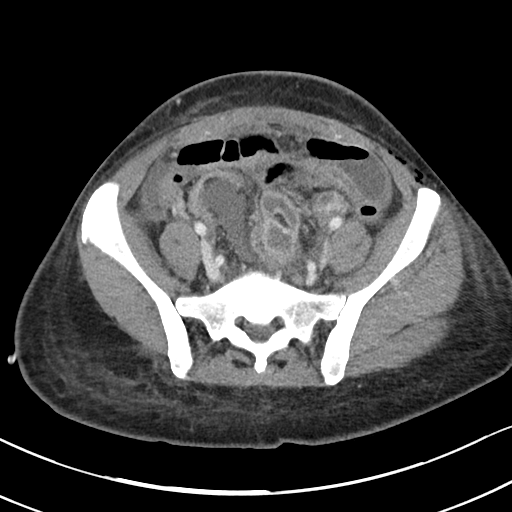
[im 45/95  soft-tissue]
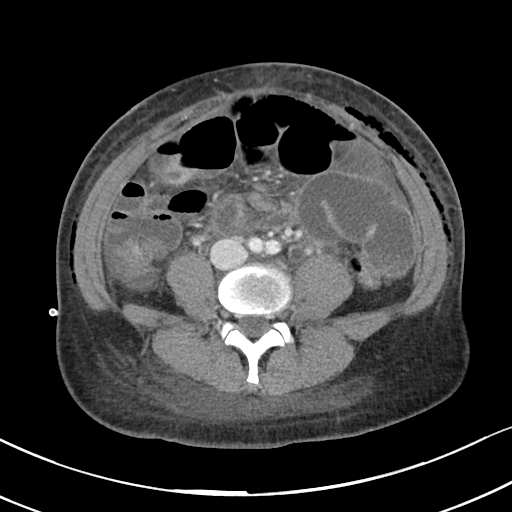
[im 50/95  soft-tissue]
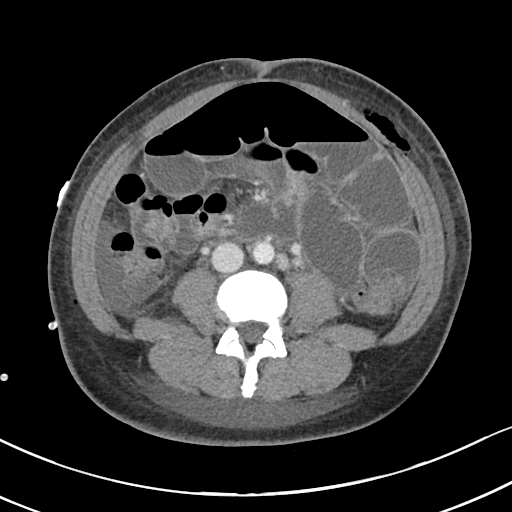
[im 61/95  soft-tissue]
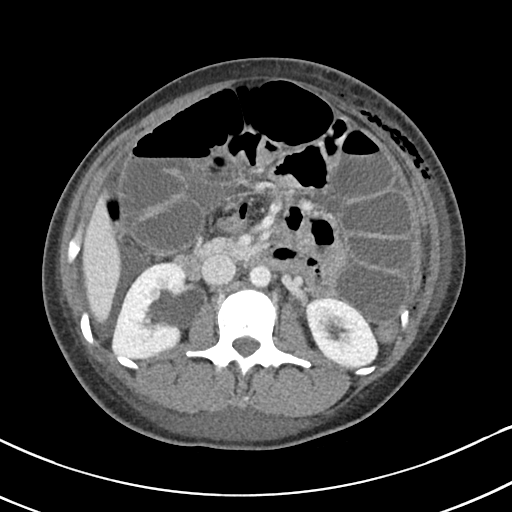
[im 72/95  soft-tissue]
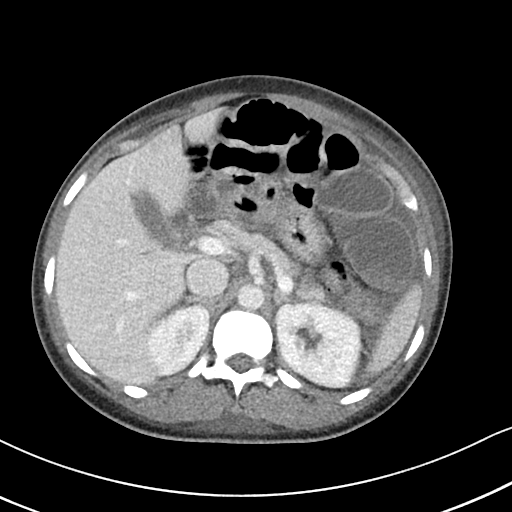
[im 78/95  soft-tissue]
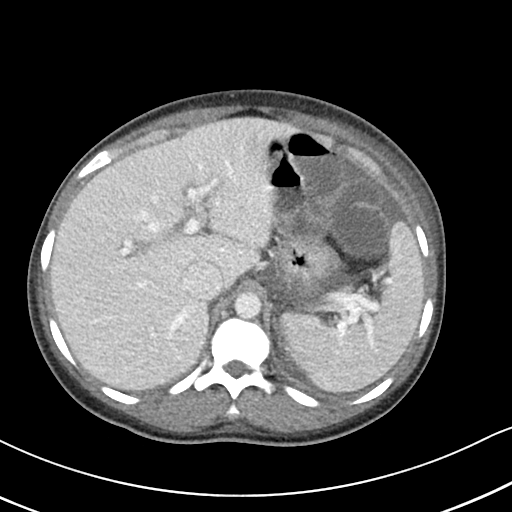
[im 78/95  bone]
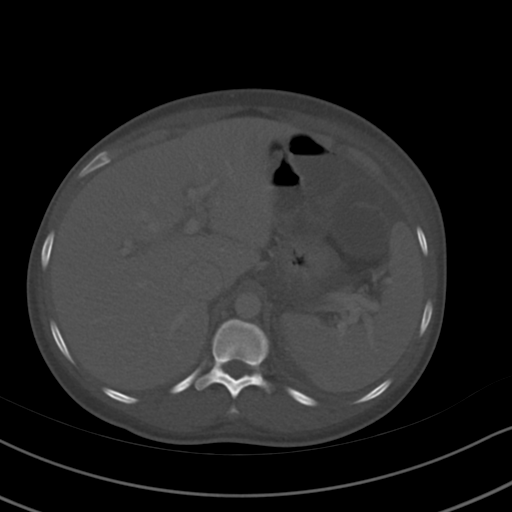
[im 89/95  soft-tissue]
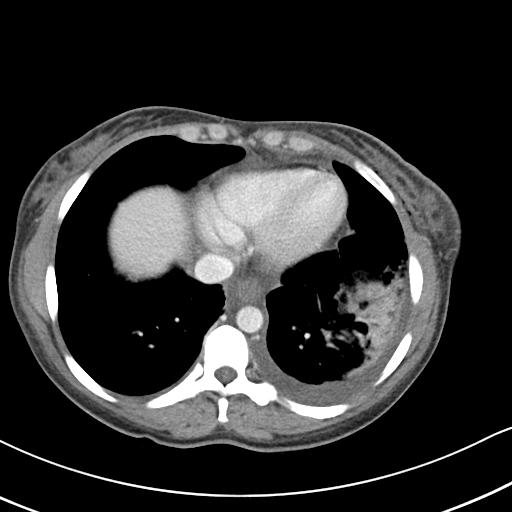

[Series 6: abdomen 3.0 mpr cor · coronal · 0.64mm/px · 3 of 90 slices shown]
[im 30/90  soft-tissue]
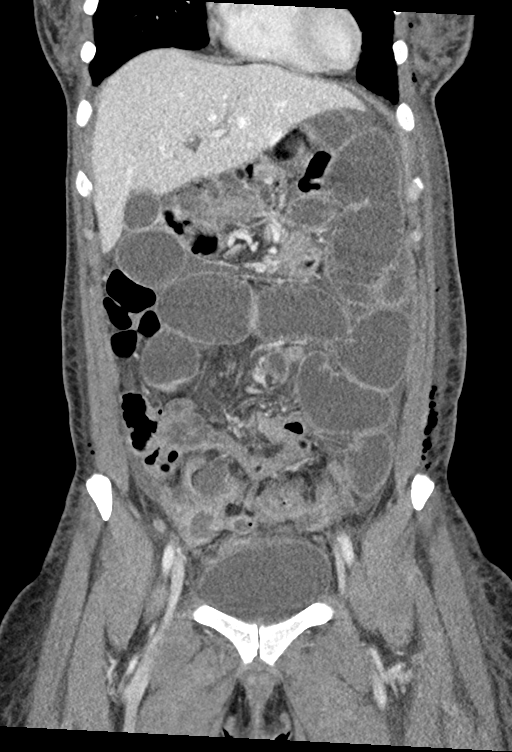
[im 40/90  soft-tissue]
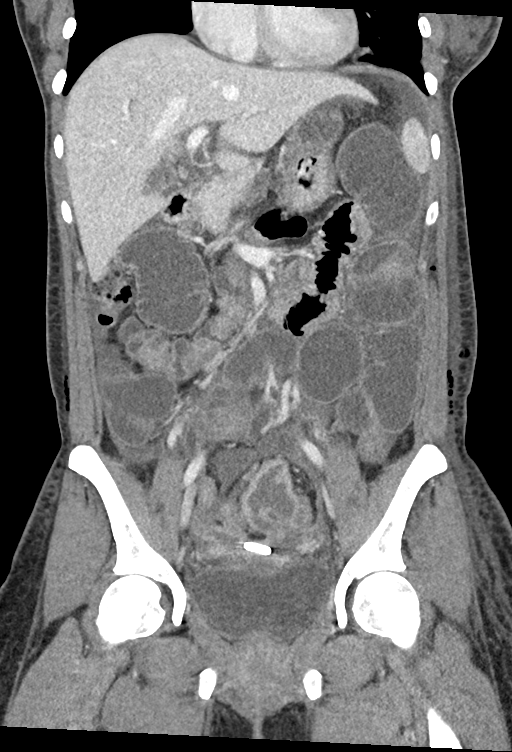
[im 50/90  soft-tissue]
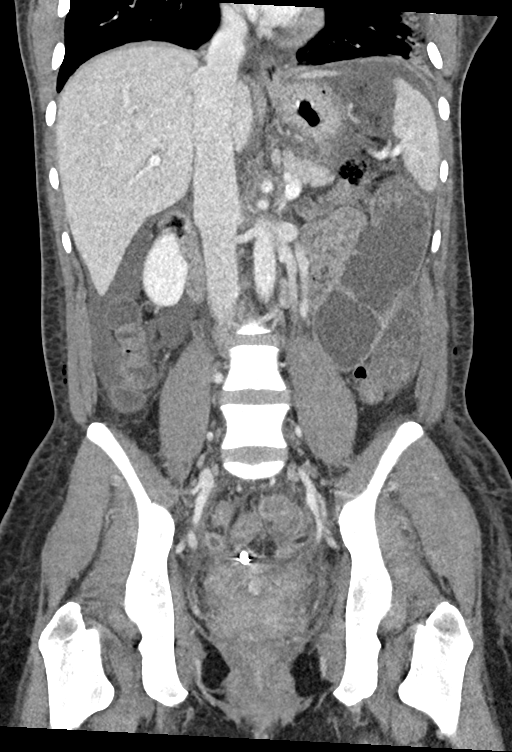

[13 of 46 positions shown; findings below may reference images not displayed]

FINDINGS: Lower chest: Interval development of a patchy airspace opacities in
the periphery of the left lower lobe with a small associated pleural
effusion. Mild dependent atelectasis in the right lower lobe. The
visualized cardiac structures are within normal limits. No
pericardial effusion.

Hepatobiliary: No hepatic injury or perihepatic hematoma.
Gallbladder is unremarkable

Pancreas: Unremarkable. No pancreatic ductal dilatation or
surrounding inflammatory changes.

Spleen: Normal in size without focal abnormality.

Adrenals/Urinary Tract: Interval development of a mild bilateral
hydronephrosis. The adrenal glands are unremarkable. The bladder is
unremarkable. The right ureter is mildly dilated.

Stomach/Bowel: Significant dilation of small bowel throughout the
abdomen with a focal transition in the mid abdomen small bowel is
dilated up to 4.6 cm.

Vascular/Lymphatic: No significant vascular findings are present. No
enlarged abdominal or pelvic lymph nodes.

Reproductive: Surgical changes of hysterectomy.  No adnexal masses.

Other: Multiple peripherally enhancing intermediate attenuation
fluid collections are again identified in the pelvis and low abdomen
in the region of the small bowel mesentery. The collection is
somewhat serpentine in configuration and all loculations appear to
communicate centrally. This is most well demonstrated on the coronal
reformatted images. The collections are more well-defined on today's
examination compared to [DATE] but are not substantially different in
size. The largest collection within the small bowel mesentery
measures 4.4 by 5.0 x 3.3 cm. The percutaneous drainage catheter
enters via a right transgluteal approach and is well positioned in
the central aspect of the complex fluid collections. The collection
in the pelvic cul-de-sac is slightly improved at 4.7 x 2.8 cm
compared to 5.2 x 4.5 cm previously. Interval development of mild
ascites. Persistent subcutaneous air along the left abdominal wall.
Dependent edema is again noted in the subcutaneous fat posteriorly.
Additionally, edema now extends along the mons pubis and left lower
extremity.

Musculoskeletal: No acute fracture or aggressive appearing lytic or
blastic osseous lesion.
IMPRESSION: 1. Persistent and more well-defined peripherally enhancing fluid
collection with the epicenter in the surgical bed in the pelvis. The
fluid collection is stellate with multiple finger-like loculations
which radiate out in the pelvis and up into the small bowel
mesentery. The percutaneous drainage catheter remains well
positioned within the central aspect of the fluid collection. The
portion in the pelvic cul-de-sac is slightly improved compared to
[DATE], however the other loculations are insignificantly changed. As
noted at the time of percutaneous drain placement, this likely
represents postoperative hematoma.
2. Severe ileus versus partial small bowel obstruction with a
transition point in the mid abdomen presumably related to underlying
adhesions.
3. Developing bilateral mild hydronephrosis likely secondary to
inflammatory changes impinging upon the ureters in the pelvis.
4. Interval development of patchy airspace opacities in the left
lower lobe with an associated left pleural effusion. Findings are
concerning for possible left lower lobe pneumonia versus small
volume aspiration.
5. Developing ascites, likely reactive and related to the
ileus/bowel obstruction.
6. Persistent subcutaneous emphysema and edema along the soft
tissues of the left anterolateral abdomen.
7. Dependent anasarca.
8. Asymmetric left lower extremity soft tissue edema. No evidence of
DVT in the well visualized iliac and proximal femoral veins.

## 2020-01-18 IMAGING — CT CT ABD-PELV W/ CM
2 of 4 series · 11 of 46 positions shown, 12 images · IV contrast (iopamidol)
Comparison: Prior CT abdomen/pelvis 06/08/2019

CLINICAL DATA: 43-year-old female status post robotic assisted
laparoscopic hysterectomy complicated by intraperitoneal hematoma
formation necessitating placement of a percutaneous drainage
catheter on 06/04/2019. She is now discharged and doing well with
minimal drain output. She presents for follow-up imaging.

EXAM:
CT ABDOMEN AND PELVIS WITH CONTRAST
TECHNIQUE: Multidetector CT imaging of the abdomen and pelvis was performed
using the standard protocol following bolus administration of
intravenous contrast.
CONTRAST:  100mL [id number] IOPAMIDOL ([id number]) INJECTION 61%

[Series 2: abd pelvis 5.00 br40 s3 axial · axial · 0.44mm/px · z∈[+1226,+1576]mm · 8 of 85 slices shown, 9 images]
[im 8/85  soft-tissue]
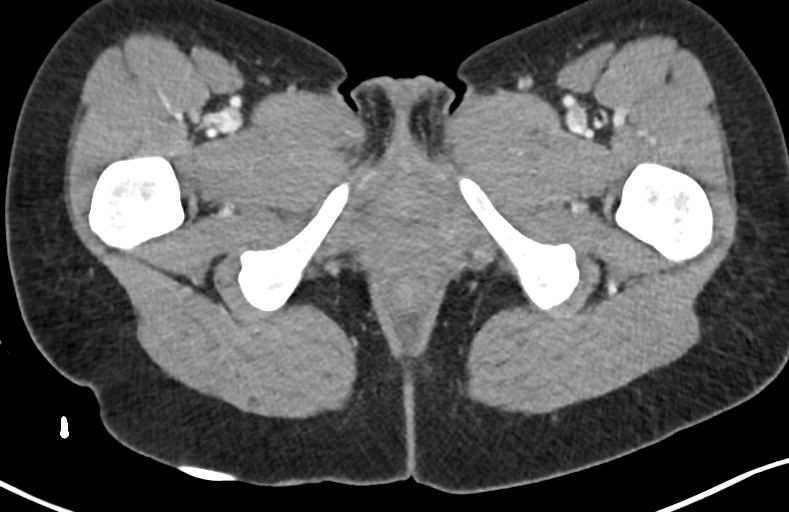
[im 8/85  bone]
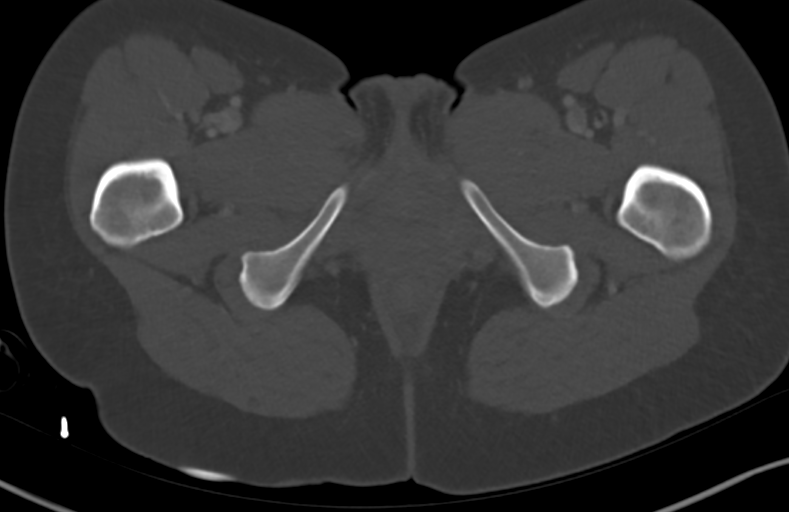
[im 18/85  soft-tissue]
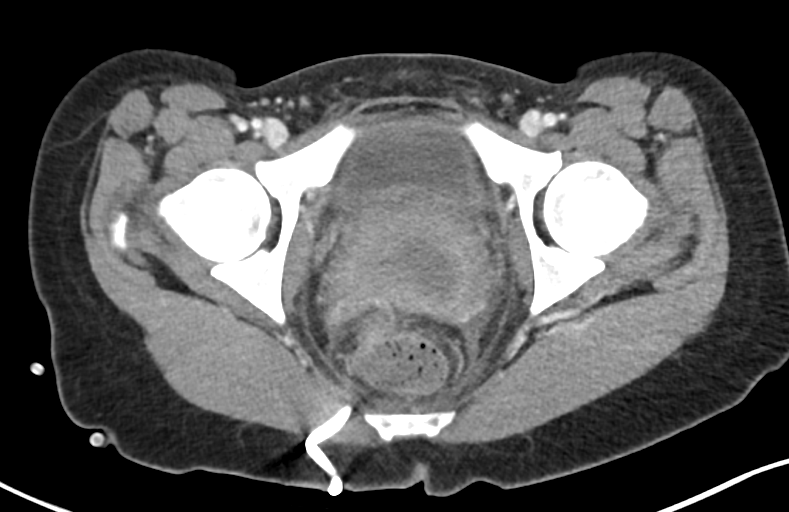
[im 29/85  soft-tissue]
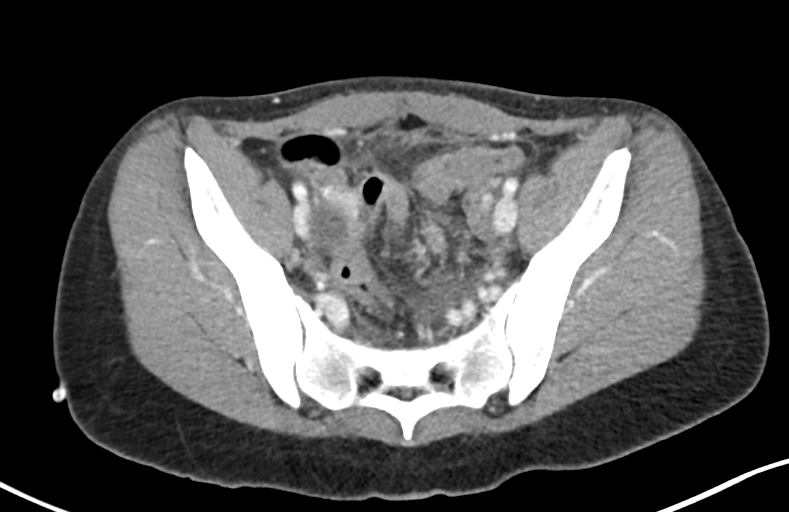
[im 39/85  soft-tissue]
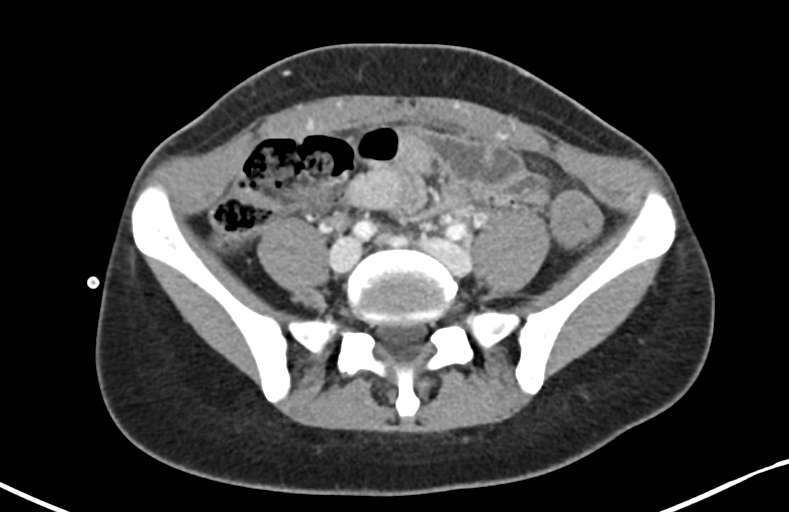
[im 46/85  soft-tissue]
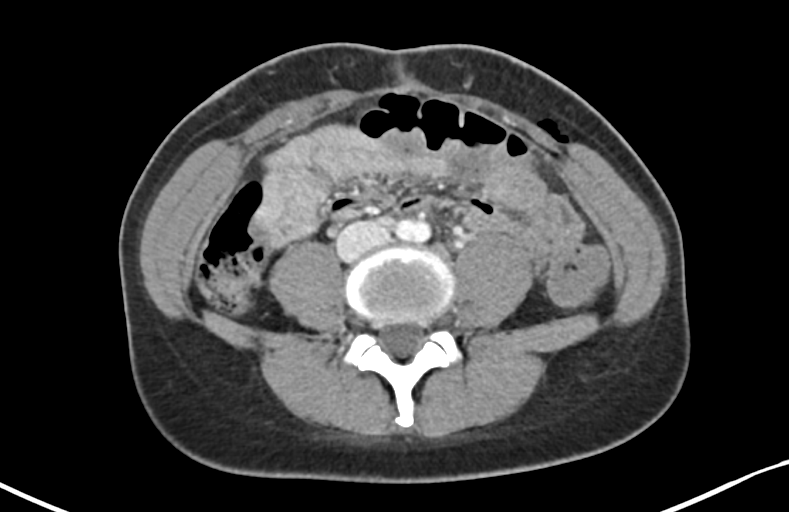
[im 57/85  soft-tissue]
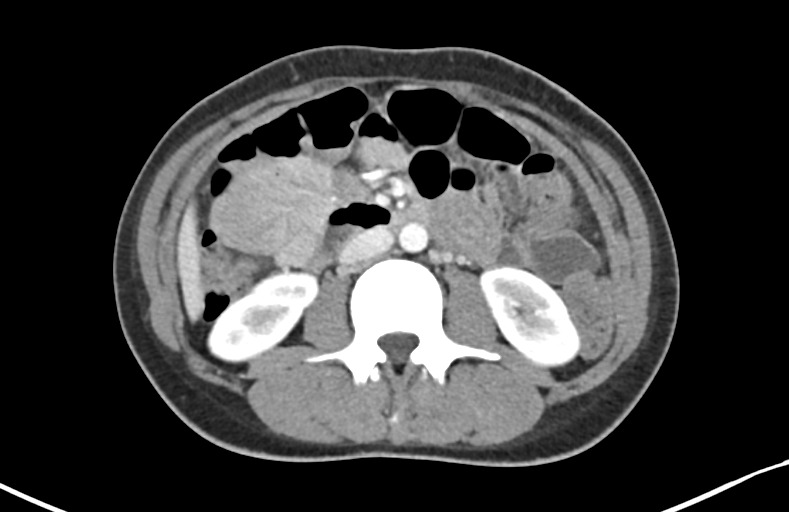
[im 67/85  soft-tissue]
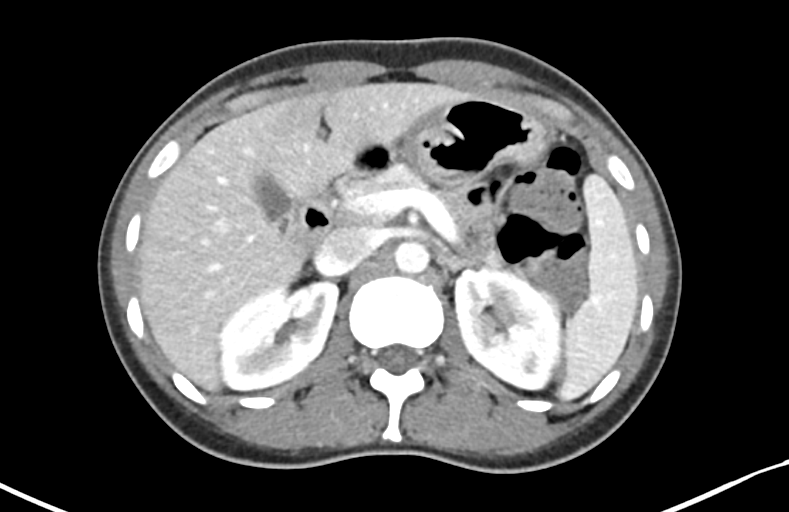
[im 78/85  soft-tissue]
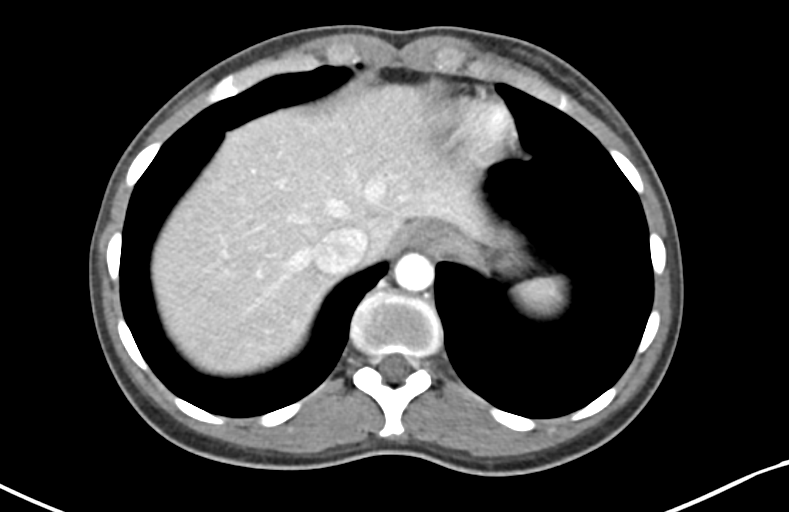

[Series 6: abd pelvis 2.00 br40 s3 cor · coronal · 0.68mm/px · 3 of 125 slices shown]
[im 42/125  soft-tissue]
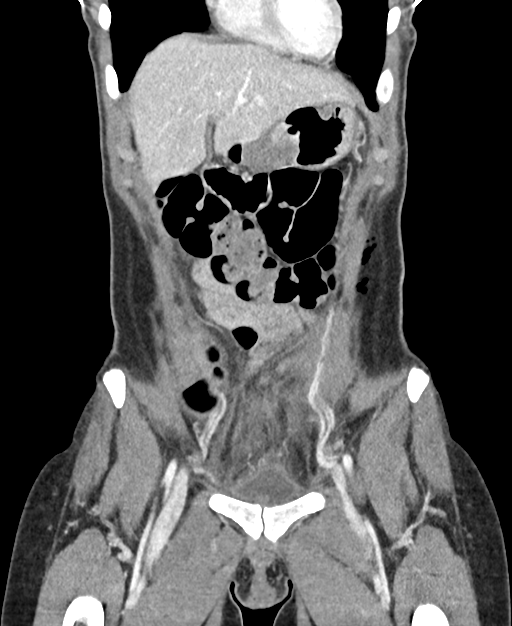
[im 56/125  soft-tissue]
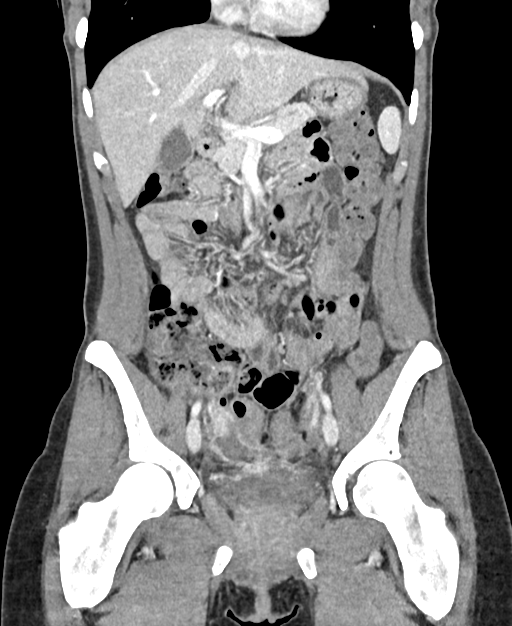
[im 69/125  soft-tissue]
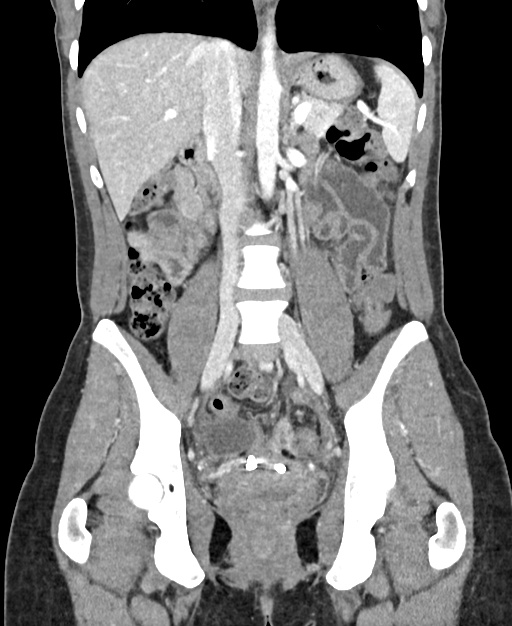

[11 of 46 positions shown; findings below may reference images not displayed]

FINDINGS: Lower chest: No acute abnormality. Interval resolution of pleural
effusions.

Hepatobiliary: No focal liver abnormality is seen. No gallstones,
gallbladder wall thickening, or biliary dilatation.

Pancreas: Unremarkable. No pancreatic ductal dilatation or
surrounding inflammatory changes.

Spleen: Normal in size without focal abnormality.

Adrenals/Urinary Tract: Adrenal glands are unremarkable. Kidneys are
normal, without renal calculi, focal lesion, or hydronephrosis.
Bladder is unremarkable.

Stomach/Bowel: Stomach is within normal limits. Appendix appears
normal. No evidence of bowel wall thickening, distention, or
inflammatory changes.

Vascular/Lymphatic: No significant vascular findings are present. No
enlarged abdominal or pelvic lymph nodes.

Reproductive: Surgical changes of prior hysterectomy. Circumscribed
simple cyst in the right adnexa measuring up to 3.8 cm is new and
almost certainly represents a benign ovarian cyst. Percutaneous
drainage catheter remains well positioned just cephalad to the
vaginal cuff. No left adnexal mass.

Other: Complete interval resolution of peripherally enhancing fluid
collections from within the lower abdomen and pelvis. There is trace
non loculated free fluid in the pelvis.

Musculoskeletal: No acute or significant osseous findings.
IMPRESSION: 1. Remarkable interval improvement with resolution of
intraperitoneal fluid collections, partial small bowel obstruction,
mild hydronephrosis and pleural effusions.
2. Drainage catheter remains in good position.
3. Interval development of a right ovarian follicular cyst of no
clinical significance.

## 2020-03-10 DIAGNOSIS — Z6821 Body mass index (BMI) 21.0-21.9, adult: Secondary | ICD-10-CM | POA: Diagnosis not present

## 2020-03-10 DIAGNOSIS — Z1231 Encounter for screening mammogram for malignant neoplasm of breast: Secondary | ICD-10-CM | POA: Diagnosis not present

## 2020-03-10 DIAGNOSIS — Z113 Encounter for screening for infections with a predominantly sexual mode of transmission: Secondary | ICD-10-CM | POA: Diagnosis not present

## 2020-03-10 DIAGNOSIS — Z01419 Encounter for gynecological examination (general) (routine) without abnormal findings: Secondary | ICD-10-CM | POA: Diagnosis not present

## 2020-03-21 ENCOUNTER — Ambulatory Visit
Admission: RE | Admit: 2020-03-21 | Discharge: 2020-03-21 | Disposition: A | Discharge status: Home / Self Care | Payer: BC Managed Care – PPO | Source: Ambulatory Visit | Attending: Internal Medicine | Admitting: Internal Medicine

## 2020-03-21 ENCOUNTER — Other Ambulatory Visit: Payer: Self-pay | Admitting: Internal Medicine

## 2020-03-21 DIAGNOSIS — Z23 Encounter for immunization: Secondary | ICD-10-CM | POA: Diagnosis not present

## 2020-03-21 DIAGNOSIS — R140 Abdominal distension (gaseous): Secondary | ICD-10-CM

## 2020-04-15 DIAGNOSIS — F411 Generalized anxiety disorder: Secondary | ICD-10-CM | POA: Diagnosis not present

## 2020-04-22 DIAGNOSIS — F411 Generalized anxiety disorder: Secondary | ICD-10-CM | POA: Diagnosis not present

## 2020-04-29 DIAGNOSIS — H5213 Myopia, bilateral: Secondary | ICD-10-CM | POA: Diagnosis not present

## 2020-04-29 DIAGNOSIS — H10413 Chronic giant papillary conjunctivitis, bilateral: Secondary | ICD-10-CM | POA: Diagnosis not present

## 2020-04-29 DIAGNOSIS — F411 Generalized anxiety disorder: Secondary | ICD-10-CM | POA: Diagnosis not present

## 2020-05-15 DIAGNOSIS — F411 Generalized anxiety disorder: Secondary | ICD-10-CM | POA: Diagnosis not present

## 2020-05-29 DIAGNOSIS — F411 Generalized anxiety disorder: Secondary | ICD-10-CM | POA: Diagnosis not present

## 2020-05-30 ENCOUNTER — Encounter: Payer: Self-pay | Admitting: Internal Medicine

## 2020-05-30 ENCOUNTER — Other Ambulatory Visit: Payer: Self-pay

## 2020-05-30 ENCOUNTER — Ambulatory Visit: Payer: BC Managed Care – PPO | Admitting: Internal Medicine

## 2020-05-30 VITALS — BP 120/66 | HR 83 | Ht 66.0 in | Wt 128.5 lb

## 2020-05-30 DIAGNOSIS — E0500 Thyrotoxicosis with diffuse goiter without thyrotoxic crisis or storm: Secondary | ICD-10-CM | POA: Diagnosis not present

## 2020-05-30 LAB — T4, FREE: Free T4: 0.77 ng/dL (ref 0.60–1.60)

## 2020-05-30 LAB — TSH: TSH: 0.41 u[IU]/mL (ref 0.35–4.50)

## 2020-05-30 NOTE — Progress Notes (Signed)
Name: Deborah Murphy  MRN/ DOB: [id number], 1975-09-29    Age/ Sex: 44 y.o., female     PCP: Seward Carol, MD   Reason for Endocrinology Evaluation: Hyperthyroidism     Initial Endocrinology Clinic Visit: 06/09/18    PATIENT IDENTIFIER: Deborah Murphy is a 44 y.o., female with a and remarkable past medical history. She has followed with [facility] Endocrinology clinic since June 09, 2018 for consultative assistance with management of her Berenice Primas' disease.   HISTORICAL SUMMARY: Pt noted left eye changes during the summer, 219 . She saw her ophthalmologist and that's when she was thought to have Graves' disease. She presented to her PCP and TSH confirmed to be low, with normal FT4 and T3.  A thyroid uptake and Scan consistent with Graves' Disease   Methimazole started in 05/2018 and stopped 07/24/2019   S/P tubal ligation 2005  No Fh of thyroid disease SUBJECTIVE:    Today (05/30/2020):  Deborah Murphy is here for a 6 month follow up on her Graves' Disease.   Weight has been stable  She denies diarrhea or palpitations Has noted worsening anxiety   Denies any local neck symptoms.    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 CESAREAN SECTION    . CYSTOSCOPY N/A 05/25/2019   Procedure: CYSTOSCOPY;  Surgeon: Bobbye Charleston, MD;  Location: [facility];  Service: Gynecology;  Laterality: N/A;  . IR RADIOLOGIST EVAL & MGMT  06/19/2019  . ROBOTIC ASSISTED LAPAROSCOPIC HYSTERECTOMY AND SALPINGECTOMY Bilateral 05/25/2019   Procedure: XI ROBOTIC ASSISTED LAPAROSCOPIC HYSTERECTOMY AND SALPINGECTOMY;  Surgeon: Bobbye Charleston, MD;  Location: [facility];  Service: Gynecology;  Laterality: Bilateral;  . TUBAL LIGATION      Social History:  reports that she has never smoked. She has never used smokeless  tobacco. She reports that she does not drink alcohol and does not use drugs.  Family History: family history includes Diabetes in her mother; Hypertension in her mother.   HOME MEDICATIONS: Allergies as of 05/30/2020   No Known Allergies     Medication List       Accurate as of May 30, 2020  3:26 PM. If you have any questions, ask your nurse or doctor.        STOP taking these medications   oxyCODONE-acetaminophen 5-325 MG tablet Commonly known as: PERCOCET/ROXICET Stopped by: Dorita Sciara, MD     TAKE these medications   albuterol 108 (90 Base) MCG/ACT inhaler Commonly known as: VENTOLIN HFA Inhale 2 puffs into the lungs every 6 (six) hours as needed for wheezing or shortness of breath.   benzonatate 200 MG capsule Commonly known as: TESSALON Take 1 capsule (200 mg total) by mouth 3 (three) times daily as needed for cough.   docusate sodium 100 MG capsule Commonly known as: COLACE Take 1 capsule (100 mg total) by mouth 2 (two) times daily.   esomeprazole 20 MG capsule Commonly known as: NEXIUM Take 20 mg by mouth every evening.   ibuprofen 800 MG tablet Commonly known as: ADVIL Take 1 tablet (800 mg total) by mouth every 8 (eight) hours as needed (mild pain).   prenatal multivitamin Tabs tablet Take 1 tablet by mouth daily at 12 noon.   triamcinolone ointment 0.1 % Commonly known as: KENALOG Apply 1 application topically daily as needed for irritation.  Vitamin D (Ergocalciferol) 1.25 MG (50000 UNIT) Caps capsule Commonly known as: DRISDOL Take 1 capsule (50,000 Units total) by mouth every 7 (seven) days.         OBJECTIVE:   PHYSICAL EXAM: VS: BP 120/66   Pulse 83   Ht 5\' 6"  (1.676 m)   Wt 128 lb 8 oz (58.3 kg)   LMP  (LMP Unknown)   SpO2 100%   BMI 20.74 kg/m    EXAM: General: Pt appears well and is in NAD  Eyes Eyes: Decreased left stare, no lid lag or exophthalmos   Neck: General: Supple without adenopathy. Thyroid:  Thyroid size normal.  No goiter or nodules appreciated.   Lungs: Clear with good BS bilat with no rales, rhonchi, or wheezes  Heart: Auscultation: RRR.  Abdomen: Normoactive bowel sounds, soft, nontender, without masses or organomegaly palpable  Extremities:  BL LE: No pretibial edema normal ROM and strength.  Mental Status: Judgment, insight: Intact Orientation: Oriented to time, place, and person Mood and affect: No depression, anxiety, or agitation     DATA REVIEWED: Results for Deborah Murphy, Deborah Murphy (MRN [id number]) as of 06/11/2020 06:41  Ref. Range 05/30/2020 15:48  TSH Latest Ref Range: 0.35 - 4.50 uIU/mL 0.41  T4,Free(Direct) Latest Ref Range: 0.60 - 1.60 ng/dL 0.77     ASSESSMENT / PLAN / RECOMMENDATIONS:   1.  Hyperthyroidism Secondary to Graves' disease:  -She is clinically euthyroid  -She has been off methimazole since 07/2019 - Repeat TFT's remain to be normal. Pt continued to be in remission      2.  Graves' disease:  - Her left eye orbitopathy seems to be resolving.  - She is up to date on her eye exams   F/U PRN      Signed electronically by: Mack Guise, MD  [facility] Endocrinology  [facility] [address] [place], [place] [address] Phone: [phone] FAX: [phone]      CC: Seward Carol, [address] [address] Phone: [phone]  Fax: [phone]   Return to Endocrinology clinic as below: No future appointments.

## 2020-06-12 DIAGNOSIS — F411 Generalized anxiety disorder: Secondary | ICD-10-CM | POA: Diagnosis not present

## 2020-06-26 DIAGNOSIS — F411 Generalized anxiety disorder: Secondary | ICD-10-CM | POA: Diagnosis not present

## 2020-07-10 DIAGNOSIS — F411 Generalized anxiety disorder: Secondary | ICD-10-CM | POA: Diagnosis not present

## 2020-07-24 DIAGNOSIS — F411 Generalized anxiety disorder: Secondary | ICD-10-CM | POA: Diagnosis not present

## 2020-07-31 DIAGNOSIS — F411 Generalized anxiety disorder: Secondary | ICD-10-CM | POA: Diagnosis not present

## 2020-08-07 DIAGNOSIS — F411 Generalized anxiety disorder: Secondary | ICD-10-CM | POA: Diagnosis not present

## 2020-08-15 DIAGNOSIS — F411 Generalized anxiety disorder: Secondary | ICD-10-CM | POA: Diagnosis not present

## 2020-08-20 DIAGNOSIS — F411 Generalized anxiety disorder: Secondary | ICD-10-CM | POA: Diagnosis not present

## 2020-09-12 DIAGNOSIS — F411 Generalized anxiety disorder: Secondary | ICD-10-CM | POA: Diagnosis not present

## 2020-09-25 DIAGNOSIS — Z13220 Encounter for screening for lipoid disorders: Secondary | ICD-10-CM | POA: Diagnosis not present

## 2020-09-25 DIAGNOSIS — Z0000 Encounter for general adult medical examination without abnormal findings: Secondary | ICD-10-CM | POA: Diagnosis not present

## 2020-09-25 DIAGNOSIS — Z113 Encounter for screening for infections with a predominantly sexual mode of transmission: Secondary | ICD-10-CM | POA: Diagnosis not present

## 2020-09-25 DIAGNOSIS — E0500 Thyrotoxicosis with diffuse goiter without thyrotoxic crisis or storm: Secondary | ICD-10-CM | POA: Diagnosis not present

## 2020-10-02 DIAGNOSIS — F411 Generalized anxiety disorder: Secondary | ICD-10-CM | POA: Diagnosis not present

## 2020-10-09 DIAGNOSIS — F411 Generalized anxiety disorder: Secondary | ICD-10-CM | POA: Diagnosis not present

## 2020-12-08 DIAGNOSIS — F411 Generalized anxiety disorder: Secondary | ICD-10-CM | POA: Diagnosis not present

## 2020-12-10 DIAGNOSIS — F411 Generalized anxiety disorder: Secondary | ICD-10-CM | POA: Diagnosis not present

## 2020-12-16 DIAGNOSIS — F411 Generalized anxiety disorder: Secondary | ICD-10-CM | POA: Diagnosis not present

## 2020-12-23 DIAGNOSIS — F411 Generalized anxiety disorder: Secondary | ICD-10-CM | POA: Diagnosis not present

## 2020-12-24 DIAGNOSIS — F411 Generalized anxiety disorder: Secondary | ICD-10-CM | POA: Diagnosis not present

## 2020-12-31 DIAGNOSIS — F411 Generalized anxiety disorder: Secondary | ICD-10-CM | POA: Diagnosis not present

## 2021-01-07 DIAGNOSIS — F411 Generalized anxiety disorder: Secondary | ICD-10-CM | POA: Diagnosis not present

## 2021-01-12 DIAGNOSIS — F411 Generalized anxiety disorder: Secondary | ICD-10-CM | POA: Diagnosis not present

## 2021-01-15 DIAGNOSIS — F411 Generalized anxiety disorder: Secondary | ICD-10-CM | POA: Diagnosis not present

## 2021-01-19 DIAGNOSIS — F411 Generalized anxiety disorder: Secondary | ICD-10-CM | POA: Diagnosis not present

## 2021-01-28 DIAGNOSIS — F411 Generalized anxiety disorder: Secondary | ICD-10-CM | POA: Diagnosis not present

## 2021-02-03 DIAGNOSIS — F411 Generalized anxiety disorder: Secondary | ICD-10-CM | POA: Diagnosis not present

## 2021-02-05 DIAGNOSIS — F411 Generalized anxiety disorder: Secondary | ICD-10-CM | POA: Diagnosis not present

## 2021-02-18 DIAGNOSIS — F411 Generalized anxiety disorder: Secondary | ICD-10-CM | POA: Diagnosis not present

## 2021-02-25 DIAGNOSIS — F411 Generalized anxiety disorder: Secondary | ICD-10-CM | POA: Diagnosis not present

## 2021-02-26 DIAGNOSIS — F411 Generalized anxiety disorder: Secondary | ICD-10-CM | POA: Diagnosis not present

## 2021-03-04 DIAGNOSIS — F411 Generalized anxiety disorder: Secondary | ICD-10-CM | POA: Diagnosis not present

## 2021-03-11 DIAGNOSIS — F411 Generalized anxiety disorder: Secondary | ICD-10-CM | POA: Diagnosis not present

## 2021-03-16 DIAGNOSIS — Z6822 Body mass index (BMI) 22.0-22.9, adult: Secondary | ICD-10-CM | POA: Diagnosis not present

## 2021-03-16 DIAGNOSIS — Z1231 Encounter for screening mammogram for malignant neoplasm of breast: Secondary | ICD-10-CM | POA: Diagnosis not present

## 2021-03-16 DIAGNOSIS — Z01419 Encounter for gynecological examination (general) (routine) without abnormal findings: Secondary | ICD-10-CM | POA: Diagnosis not present

## 2021-03-16 DIAGNOSIS — Z113 Encounter for screening for infections with a predominantly sexual mode of transmission: Secondary | ICD-10-CM | POA: Diagnosis not present

## 2021-03-18 DIAGNOSIS — F411 Generalized anxiety disorder: Secondary | ICD-10-CM | POA: Diagnosis not present

## 2021-03-25 DIAGNOSIS — F411 Generalized anxiety disorder: Secondary | ICD-10-CM | POA: Diagnosis not present

## 2021-03-26 DIAGNOSIS — F411 Generalized anxiety disorder: Secondary | ICD-10-CM | POA: Diagnosis not present

## 2021-04-01 DIAGNOSIS — F411 Generalized anxiety disorder: Secondary | ICD-10-CM | POA: Diagnosis not present

## 2021-04-08 DIAGNOSIS — F411 Generalized anxiety disorder: Secondary | ICD-10-CM | POA: Diagnosis not present

## 2021-04-16 DIAGNOSIS — F411 Generalized anxiety disorder: Secondary | ICD-10-CM | POA: Diagnosis not present

## 2021-04-22 DIAGNOSIS — F411 Generalized anxiety disorder: Secondary | ICD-10-CM | POA: Diagnosis not present

## 2021-04-23 DIAGNOSIS — F411 Generalized anxiety disorder: Secondary | ICD-10-CM | POA: Diagnosis not present

## 2021-04-24 DIAGNOSIS — F411 Generalized anxiety disorder: Secondary | ICD-10-CM | POA: Diagnosis not present

## 2021-04-29 DIAGNOSIS — F411 Generalized anxiety disorder: Secondary | ICD-10-CM | POA: Diagnosis not present

## 2021-05-06 DIAGNOSIS — F411 Generalized anxiety disorder: Secondary | ICD-10-CM | POA: Diagnosis not present

## 2021-05-13 DIAGNOSIS — F411 Generalized anxiety disorder: Secondary | ICD-10-CM | POA: Diagnosis not present

## 2021-05-14 DIAGNOSIS — Z23 Encounter for immunization: Secondary | ICD-10-CM | POA: Diagnosis not present

## 2021-05-14 DIAGNOSIS — E0500 Thyrotoxicosis with diffuse goiter without thyrotoxic crisis or storm: Secondary | ICD-10-CM | POA: Diagnosis not present

## 2021-05-14 DIAGNOSIS — L659 Nonscarring hair loss, unspecified: Secondary | ICD-10-CM | POA: Diagnosis not present

## 2021-05-14 DIAGNOSIS — Z113 Encounter for screening for infections with a predominantly sexual mode of transmission: Secondary | ICD-10-CM | POA: Diagnosis not present

## 2021-05-16 DIAGNOSIS — U071 COVID-19: Secondary | ICD-10-CM | POA: Diagnosis not present

## 2021-05-16 DIAGNOSIS — B349 Viral infection, unspecified: Secondary | ICD-10-CM | POA: Diagnosis not present

## 2021-05-20 DIAGNOSIS — F411 Generalized anxiety disorder: Secondary | ICD-10-CM | POA: Diagnosis not present

## 2021-06-01 DIAGNOSIS — F411 Generalized anxiety disorder: Secondary | ICD-10-CM | POA: Diagnosis not present

## 2021-06-10 DIAGNOSIS — K567 Ileus, unspecified: Secondary | ICD-10-CM | POA: Diagnosis not present

## 2021-06-10 DIAGNOSIS — B3731 Acute candidiasis of vulva and vagina: Secondary | ICD-10-CM | POA: Diagnosis not present

## 2021-06-10 DIAGNOSIS — N760 Acute vaginitis: Secondary | ICD-10-CM | POA: Diagnosis not present

## 2021-06-10 DIAGNOSIS — F411 Generalized anxiety disorder: Secondary | ICD-10-CM | POA: Diagnosis not present

## 2021-06-10 DIAGNOSIS — Z6822 Body mass index (BMI) 22.0-22.9, adult: Secondary | ICD-10-CM | POA: Diagnosis not present

## 2021-06-17 DIAGNOSIS — D123 Benign neoplasm of transverse colon: Secondary | ICD-10-CM | POA: Diagnosis not present

## 2021-06-17 DIAGNOSIS — F411 Generalized anxiety disorder: Secondary | ICD-10-CM | POA: Diagnosis not present

## 2021-06-17 DIAGNOSIS — Z1211 Encounter for screening for malignant neoplasm of colon: Secondary | ICD-10-CM | POA: Diagnosis not present

## 2021-06-24 DIAGNOSIS — F411 Generalized anxiety disorder: Secondary | ICD-10-CM | POA: Diagnosis not present

## 2021-06-29 DIAGNOSIS — J069 Acute upper respiratory infection, unspecified: Secondary | ICD-10-CM | POA: Diagnosis not present

## 2021-07-01 DIAGNOSIS — F411 Generalized anxiety disorder: Secondary | ICD-10-CM | POA: Diagnosis not present

## 2021-07-07 DIAGNOSIS — H10413 Chronic giant papillary conjunctivitis, bilateral: Secondary | ICD-10-CM | POA: Diagnosis not present

## 2021-07-07 DIAGNOSIS — H5213 Myopia, bilateral: Secondary | ICD-10-CM | POA: Diagnosis not present

## 2021-07-22 DIAGNOSIS — F411 Generalized anxiety disorder: Secondary | ICD-10-CM | POA: Diagnosis not present

## 2021-07-24 DIAGNOSIS — F411 Generalized anxiety disorder: Secondary | ICD-10-CM | POA: Diagnosis not present

## 2021-08-05 DIAGNOSIS — F411 Generalized anxiety disorder: Secondary | ICD-10-CM | POA: Diagnosis not present

## 2021-08-06 DIAGNOSIS — F411 Generalized anxiety disorder: Secondary | ICD-10-CM | POA: Diagnosis not present

## 2021-08-07 DIAGNOSIS — F411 Generalized anxiety disorder: Secondary | ICD-10-CM | POA: Diagnosis not present

## 2021-08-12 DIAGNOSIS — F411 Generalized anxiety disorder: Secondary | ICD-10-CM | POA: Diagnosis not present

## 2021-08-21 DIAGNOSIS — N898 Other specified noninflammatory disorders of vagina: Secondary | ICD-10-CM | POA: Diagnosis not present

## 2021-08-21 DIAGNOSIS — Z7251 High risk heterosexual behavior: Secondary | ICD-10-CM | POA: Diagnosis not present

## 2021-08-21 DIAGNOSIS — R1030 Lower abdominal pain, unspecified: Secondary | ICD-10-CM | POA: Diagnosis not present

## 2021-08-25 DIAGNOSIS — N760 Acute vaginitis: Secondary | ICD-10-CM | POA: Diagnosis not present

## 2021-08-26 DIAGNOSIS — F411 Generalized anxiety disorder: Secondary | ICD-10-CM | POA: Diagnosis not present

## 2021-09-02 DIAGNOSIS — F411 Generalized anxiety disorder: Secondary | ICD-10-CM | POA: Diagnosis not present

## 2021-09-09 DIAGNOSIS — F411 Generalized anxiety disorder: Secondary | ICD-10-CM | POA: Diagnosis not present

## 2021-09-12 DIAGNOSIS — F411 Generalized anxiety disorder: Secondary | ICD-10-CM | POA: Diagnosis not present

## 2021-09-17 DIAGNOSIS — F411 Generalized anxiety disorder: Secondary | ICD-10-CM | POA: Diagnosis not present

## 2021-09-23 DIAGNOSIS — F411 Generalized anxiety disorder: Secondary | ICD-10-CM | POA: Diagnosis not present

## 2021-09-24 DIAGNOSIS — F411 Generalized anxiety disorder: Secondary | ICD-10-CM | POA: Diagnosis not present

## 2021-09-29 DIAGNOSIS — Z23 Encounter for immunization: Secondary | ICD-10-CM | POA: Diagnosis not present

## 2021-09-29 DIAGNOSIS — Z113 Encounter for screening for infections with a predominantly sexual mode of transmission: Secondary | ICD-10-CM | POA: Diagnosis not present

## 2021-09-29 DIAGNOSIS — E0500 Thyrotoxicosis with diffuse goiter without thyrotoxic crisis or storm: Secondary | ICD-10-CM | POA: Diagnosis not present

## 2021-09-29 DIAGNOSIS — Z0000 Encounter for general adult medical examination without abnormal findings: Secondary | ICD-10-CM | POA: Diagnosis not present

## 2021-09-29 DIAGNOSIS — Z13220 Encounter for screening for lipoid disorders: Secondary | ICD-10-CM | POA: Diagnosis not present

## 2021-10-02 DIAGNOSIS — F411 Generalized anxiety disorder: Secondary | ICD-10-CM | POA: Diagnosis not present

## 2021-10-08 DIAGNOSIS — F411 Generalized anxiety disorder: Secondary | ICD-10-CM | POA: Diagnosis not present

## 2021-10-15 DIAGNOSIS — F411 Generalized anxiety disorder: Secondary | ICD-10-CM | POA: Diagnosis not present

## 2021-10-22 DIAGNOSIS — F411 Generalized anxiety disorder: Secondary | ICD-10-CM | POA: Diagnosis not present

## 2021-10-25 DIAGNOSIS — N898 Other specified noninflammatory disorders of vagina: Secondary | ICD-10-CM | POA: Diagnosis not present

## 2021-10-26 DIAGNOSIS — B3731 Acute candidiasis of vulva and vagina: Secondary | ICD-10-CM | POA: Diagnosis not present

## 2021-10-26 DIAGNOSIS — N760 Acute vaginitis: Secondary | ICD-10-CM | POA: Diagnosis not present

## 2021-10-26 DIAGNOSIS — B9689 Other specified bacterial agents as the cause of diseases classified elsewhere: Secondary | ICD-10-CM | POA: Diagnosis not present

## 2021-11-18 DIAGNOSIS — F411 Generalized anxiety disorder: Secondary | ICD-10-CM | POA: Diagnosis not present

## 2021-11-22 DIAGNOSIS — W57XXXA Bitten or stung by nonvenomous insect and other nonvenomous arthropods, initial encounter: Secondary | ICD-10-CM | POA: Diagnosis not present

## 2021-11-22 DIAGNOSIS — S40861A Insect bite (nonvenomous) of right upper arm, initial encounter: Secondary | ICD-10-CM | POA: Diagnosis not present

## 2021-11-22 DIAGNOSIS — S40862A Insect bite (nonvenomous) of left upper arm, initial encounter: Secondary | ICD-10-CM | POA: Diagnosis not present

## 2021-12-02 DIAGNOSIS — F411 Generalized anxiety disorder: Secondary | ICD-10-CM | POA: Diagnosis not present

## 2021-12-16 DIAGNOSIS — F411 Generalized anxiety disorder: Secondary | ICD-10-CM | POA: Diagnosis not present

## 2021-12-25 DIAGNOSIS — Q7959 Other congenital malformations of abdominal wall: Secondary | ICD-10-CM | POA: Diagnosis not present

## 2021-12-30 DIAGNOSIS — F411 Generalized anxiety disorder: Secondary | ICD-10-CM | POA: Diagnosis not present

## 2022-01-01 DIAGNOSIS — Q7959 Other congenital malformations of abdominal wall: Secondary | ICD-10-CM | POA: Diagnosis not present

## 2022-01-01 DIAGNOSIS — R1900 Intra-abdominal and pelvic swelling, mass and lump, unspecified site: Secondary | ICD-10-CM | POA: Diagnosis not present

## 2022-01-13 DIAGNOSIS — F411 Generalized anxiety disorder: Secondary | ICD-10-CM | POA: Diagnosis not present

## 2022-01-13 DIAGNOSIS — Q7959 Other congenital malformations of abdominal wall: Secondary | ICD-10-CM | POA: Diagnosis not present

## 2022-02-03 DIAGNOSIS — F411 Generalized anxiety disorder: Secondary | ICD-10-CM | POA: Diagnosis not present

## 2022-02-28 DIAGNOSIS — U071 COVID-19: Secondary | ICD-10-CM | POA: Diagnosis not present

## 2022-03-10 DIAGNOSIS — F411 Generalized anxiety disorder: Secondary | ICD-10-CM | POA: Diagnosis not present

## 2022-03-15 DIAGNOSIS — F411 Generalized anxiety disorder: Secondary | ICD-10-CM | POA: Diagnosis not present

## 2022-03-17 DIAGNOSIS — F411 Generalized anxiety disorder: Secondary | ICD-10-CM | POA: Diagnosis not present

## 2022-03-18 DIAGNOSIS — F411 Generalized anxiety disorder: Secondary | ICD-10-CM | POA: Diagnosis not present

## 2022-03-19 DIAGNOSIS — Z1329 Encounter for screening for other suspected endocrine disorder: Secondary | ICD-10-CM | POA: Diagnosis not present

## 2022-03-19 DIAGNOSIS — Z113 Encounter for screening for infections with a predominantly sexual mode of transmission: Secondary | ICD-10-CM | POA: Diagnosis not present

## 2022-03-19 DIAGNOSIS — N898 Other specified noninflammatory disorders of vagina: Secondary | ICD-10-CM | POA: Diagnosis not present

## 2022-03-19 DIAGNOSIS — Z130 Encounter for screening for diseases of the blood and blood-forming organs and certain disorders involving the immune mechanism: Secondary | ICD-10-CM | POA: Diagnosis not present

## 2022-03-19 DIAGNOSIS — Z01419 Encounter for gynecological examination (general) (routine) without abnormal findings: Secondary | ICD-10-CM | POA: Diagnosis not present

## 2022-03-19 DIAGNOSIS — Z1231 Encounter for screening mammogram for malignant neoplasm of breast: Secondary | ICD-10-CM | POA: Diagnosis not present

## 2022-03-19 DIAGNOSIS — Z13220 Encounter for screening for lipoid disorders: Secondary | ICD-10-CM | POA: Diagnosis not present

## 2022-03-19 DIAGNOSIS — A499 Bacterial infection, unspecified: Secondary | ICD-10-CM | POA: Diagnosis not present

## 2022-04-05 DIAGNOSIS — M25529 Pain in unspecified elbow: Secondary | ICD-10-CM | POA: Diagnosis not present

## 2022-04-05 DIAGNOSIS — Z23 Encounter for immunization: Secondary | ICD-10-CM | POA: Diagnosis not present

## 2022-07-20 DIAGNOSIS — R21 Rash and other nonspecific skin eruption: Secondary | ICD-10-CM | POA: Diagnosis not present

## 2022-07-29 DIAGNOSIS — L810 Postinflammatory hyperpigmentation: Secondary | ICD-10-CM | POA: Diagnosis not present

## 2022-07-29 DIAGNOSIS — L309 Dermatitis, unspecified: Secondary | ICD-10-CM | POA: Diagnosis not present

## 2022-10-01 DIAGNOSIS — L810 Postinflammatory hyperpigmentation: Secondary | ICD-10-CM | POA: Diagnosis not present

## 2022-10-01 DIAGNOSIS — L815 Leukoderma, not elsewhere classified: Secondary | ICD-10-CM | POA: Diagnosis not present

## 2022-10-01 DIAGNOSIS — L309 Dermatitis, unspecified: Secondary | ICD-10-CM | POA: Diagnosis not present

## 2022-10-19 DIAGNOSIS — Z0000 Encounter for general adult medical examination without abnormal findings: Secondary | ICD-10-CM | POA: Diagnosis not present

## 2022-10-19 DIAGNOSIS — Z113 Encounter for screening for infections with a predominantly sexual mode of transmission: Secondary | ICD-10-CM | POA: Diagnosis not present

## 2022-10-19 DIAGNOSIS — Z13220 Encounter for screening for lipoid disorders: Secondary | ICD-10-CM | POA: Diagnosis not present

## 2022-10-19 DIAGNOSIS — E0500 Thyrotoxicosis with diffuse goiter without thyrotoxic crisis or storm: Secondary | ICD-10-CM | POA: Diagnosis not present

## 2022-11-10 DIAGNOSIS — F411 Generalized anxiety disorder: Secondary | ICD-10-CM | POA: Diagnosis not present

## 2023-01-02 DIAGNOSIS — N760 Acute vaginitis: Secondary | ICD-10-CM | POA: Diagnosis not present

## 2023-03-02 DIAGNOSIS — F411 Generalized anxiety disorder: Secondary | ICD-10-CM | POA: Diagnosis not present

## 2023-03-23 DIAGNOSIS — Z1231 Encounter for screening mammogram for malignant neoplasm of breast: Secondary | ICD-10-CM | POA: Diagnosis not present

## 2023-03-23 DIAGNOSIS — Z01419 Encounter for gynecological examination (general) (routine) without abnormal findings: Secondary | ICD-10-CM | POA: Diagnosis not present

## 2023-03-29 DIAGNOSIS — Z130 Encounter for screening for diseases of the blood and blood-forming organs and certain disorders involving the immune mechanism: Secondary | ICD-10-CM | POA: Diagnosis not present

## 2023-03-29 DIAGNOSIS — Z13220 Encounter for screening for lipoid disorders: Secondary | ICD-10-CM | POA: Diagnosis not present

## 2023-03-29 DIAGNOSIS — R229 Localized swelling, mass and lump, unspecified: Secondary | ICD-10-CM | POA: Diagnosis not present

## 2023-03-29 DIAGNOSIS — Z23 Encounter for immunization: Secondary | ICD-10-CM | POA: Diagnosis not present

## 2023-03-29 DIAGNOSIS — Z1329 Encounter for screening for other suspected endocrine disorder: Secondary | ICD-10-CM | POA: Diagnosis not present

## 2023-03-29 DIAGNOSIS — Z113 Encounter for screening for infections with a predominantly sexual mode of transmission: Secondary | ICD-10-CM | POA: Diagnosis not present

## 2023-06-16 DIAGNOSIS — Z20822 Contact with and (suspected) exposure to covid-19: Secondary | ICD-10-CM | POA: Diagnosis not present

## 2023-06-16 DIAGNOSIS — J029 Acute pharyngitis, unspecified: Secondary | ICD-10-CM | POA: Diagnosis not present

## 2023-06-16 DIAGNOSIS — R051 Acute cough: Secondary | ICD-10-CM | POA: Diagnosis not present

## 2023-09-26 DIAGNOSIS — F411 Generalized anxiety disorder: Secondary | ICD-10-CM | POA: Diagnosis not present

## 2023-10-07 DIAGNOSIS — L309 Dermatitis, unspecified: Secondary | ICD-10-CM | POA: Diagnosis not present

## 2023-10-07 DIAGNOSIS — L280 Lichen simplex chronicus: Secondary | ICD-10-CM | POA: Diagnosis not present

## 2023-10-07 DIAGNOSIS — L810 Postinflammatory hyperpigmentation: Secondary | ICD-10-CM | POA: Diagnosis not present

## 2023-10-07 DIAGNOSIS — L821 Other seborrheic keratosis: Secondary | ICD-10-CM | POA: Diagnosis not present

## 2023-11-03 DIAGNOSIS — S80861A Insect bite (nonvenomous), right lower leg, initial encounter: Secondary | ICD-10-CM | POA: Diagnosis not present

## 2023-11-03 DIAGNOSIS — S80862A Insect bite (nonvenomous), left lower leg, initial encounter: Secondary | ICD-10-CM | POA: Diagnosis not present

## 2023-11-23 DIAGNOSIS — Z113 Encounter for screening for infections with a predominantly sexual mode of transmission: Secondary | ICD-10-CM | POA: Diagnosis not present

## 2023-11-23 DIAGNOSIS — E0500 Thyrotoxicosis with diffuse goiter without thyrotoxic crisis or storm: Secondary | ICD-10-CM | POA: Diagnosis not present

## 2023-11-23 DIAGNOSIS — Z0000 Encounter for general adult medical examination without abnormal findings: Secondary | ICD-10-CM | POA: Diagnosis not present

## 2023-11-23 DIAGNOSIS — R030 Elevated blood-pressure reading, without diagnosis of hypertension: Secondary | ICD-10-CM | POA: Diagnosis not present

## 2023-12-13 DIAGNOSIS — N898 Other specified noninflammatory disorders of vagina: Secondary | ICD-10-CM | POA: Diagnosis not present

## 2023-12-13 DIAGNOSIS — R030 Elevated blood-pressure reading, without diagnosis of hypertension: Secondary | ICD-10-CM | POA: Diagnosis not present

## 2023-12-30 DIAGNOSIS — R030 Elevated blood-pressure reading, without diagnosis of hypertension: Secondary | ICD-10-CM | POA: Diagnosis not present

## 2023-12-30 DIAGNOSIS — N760 Acute vaginitis: Secondary | ICD-10-CM | POA: Diagnosis not present

## 2024-03-29 DIAGNOSIS — Z113 Encounter for screening for infections with a predominantly sexual mode of transmission: Secondary | ICD-10-CM | POA: Diagnosis not present

## 2024-03-29 DIAGNOSIS — Z1231 Encounter for screening mammogram for malignant neoplasm of breast: Secondary | ICD-10-CM | POA: Diagnosis not present

## 2024-03-29 DIAGNOSIS — Z01419 Encounter for gynecological examination (general) (routine) without abnormal findings: Secondary | ICD-10-CM | POA: Diagnosis not present
